# Patient Record
Sex: Female | Born: 1990 | Race: Black or African American | Hispanic: No | Marital: Single | Smoking: Never smoker
Health system: Southern US, Community
[De-identification: ages and names within clinical notes are randomized; demographics above are authoritative.]

## PROBLEM LIST (undated history)

## (undated) ENCOUNTER — Inpatient Hospital Stay (HOSPITAL_COMMUNITY): Payer: Self-pay

## (undated) DIAGNOSIS — D509 Iron deficiency anemia, unspecified: Secondary | ICD-10-CM

## (undated) DIAGNOSIS — N946 Dysmenorrhea, unspecified: Secondary | ICD-10-CM

## (undated) DIAGNOSIS — B86 Scabies: Secondary | ICD-10-CM

## (undated) DIAGNOSIS — M719 Bursopathy, unspecified: Secondary | ICD-10-CM

## (undated) DIAGNOSIS — R011 Cardiac murmur, unspecified: Secondary | ICD-10-CM

## (undated) DIAGNOSIS — L309 Dermatitis, unspecified: Secondary | ICD-10-CM

## (undated) DIAGNOSIS — M1990 Unspecified osteoarthritis, unspecified site: Secondary | ICD-10-CM

## (undated) DIAGNOSIS — K5909 Other constipation: Secondary | ICD-10-CM

## (undated) HISTORY — DX: Other constipation: K59.09

## (undated) HISTORY — DX: Iron deficiency anemia, unspecified: D50.9

## (undated) HISTORY — PX: ABDOMINAL HYSTERECTOMY: SHX81

## (undated) HISTORY — PX: COLONOSCOPY: SHX174

---
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
## 2008-01-25 ENCOUNTER — Emergency Department (HOSPITAL_COMMUNITY): Admission: EM | Admit: 2008-01-25 | Discharge: 2008-01-25 | Payer: Self-pay | Admitting: Emergency Medicine

## 2008-03-10 ENCOUNTER — Inpatient Hospital Stay (HOSPITAL_COMMUNITY): Admission: AD | Admit: 2008-03-10 | Discharge: 2008-03-10 | Payer: Self-pay | Admitting: Obstetrics & Gynecology

## 2008-06-15 ENCOUNTER — Emergency Department (HOSPITAL_COMMUNITY): Admission: EM | Admit: 2008-06-15 | Discharge: 2008-06-15 | Payer: Self-pay | Admitting: Emergency Medicine

## 2008-06-20 ENCOUNTER — Emergency Department (HOSPITAL_COMMUNITY): Admission: EM | Admit: 2008-06-20 | Discharge: 2008-06-21 | Payer: Self-pay | Admitting: Emergency Medicine

## 2008-08-08 ENCOUNTER — Emergency Department (HOSPITAL_COMMUNITY): Admission: EM | Admit: 2008-08-08 | Discharge: 2008-08-08 | Payer: Self-pay | Admitting: Emergency Medicine

## 2009-01-04 ENCOUNTER — Emergency Department (HOSPITAL_COMMUNITY): Admission: EM | Admit: 2009-01-04 | Discharge: 2009-01-04 | Payer: Self-pay | Admitting: Emergency Medicine

## 2009-01-08 ENCOUNTER — Emergency Department (HOSPITAL_COMMUNITY): Admission: EM | Admit: 2009-01-08 | Discharge: 2009-01-08 | Payer: Self-pay | Admitting: Emergency Medicine

## 2009-05-18 ENCOUNTER — Emergency Department (HOSPITAL_COMMUNITY): Admission: EM | Admit: 2009-05-18 | Discharge: 2009-05-19 | Payer: Self-pay | Admitting: Emergency Medicine

## 2009-06-25 ENCOUNTER — Emergency Department (HOSPITAL_COMMUNITY): Admission: EM | Admit: 2009-06-25 | Discharge: 2009-06-25 | Payer: Self-pay | Admitting: Family Medicine

## 2009-06-25 ENCOUNTER — Emergency Department (HOSPITAL_COMMUNITY): Admission: EM | Admit: 2009-06-25 | Discharge: 2009-06-25 | Payer: Self-pay | Admitting: Emergency Medicine

## 2009-07-15 ENCOUNTER — Emergency Department (HOSPITAL_COMMUNITY): Admission: EM | Admit: 2009-07-15 | Discharge: 2009-07-15 | Payer: Self-pay | Admitting: Emergency Medicine

## 2009-08-13 ENCOUNTER — Emergency Department (HOSPITAL_COMMUNITY): Admission: EM | Admit: 2009-08-13 | Discharge: 2009-08-14 | Payer: Self-pay | Admitting: Emergency Medicine

## 2009-10-24 ENCOUNTER — Emergency Department (HOSPITAL_COMMUNITY): Admission: EM | Admit: 2009-10-24 | Discharge: 2009-10-25 | Payer: Self-pay | Admitting: Emergency Medicine

## 2010-01-17 ENCOUNTER — Inpatient Hospital Stay (HOSPITAL_COMMUNITY): Admission: AD | Admit: 2010-01-17 | Discharge: 2010-01-17 | Payer: Self-pay | Admitting: Obstetrics and Gynecology

## 2010-02-26 ENCOUNTER — Inpatient Hospital Stay (HOSPITAL_COMMUNITY): Admission: AD | Admit: 2010-02-26 | Discharge: 2010-02-26 | Payer: Self-pay | Admitting: Obstetrics and Gynecology

## 2010-02-26 ENCOUNTER — Ambulatory Visit: Payer: Self-pay | Admitting: Physician Assistant

## 2010-03-06 ENCOUNTER — Inpatient Hospital Stay (HOSPITAL_COMMUNITY): Admission: AD | Admit: 2010-03-06 | Discharge: 2010-03-06 | Payer: Self-pay | Admitting: Obstetrics and Gynecology

## 2010-03-06 ENCOUNTER — Ambulatory Visit: Payer: Self-pay | Admitting: Family

## 2010-03-22 ENCOUNTER — Ambulatory Visit: Payer: Self-pay | Admitting: Nurse Practitioner

## 2010-03-22 ENCOUNTER — Inpatient Hospital Stay (HOSPITAL_COMMUNITY): Admission: AD | Admit: 2010-03-22 | Discharge: 2010-03-23 | Payer: Self-pay | Admitting: Obstetrics and Gynecology

## 2010-04-05 ENCOUNTER — Emergency Department (HOSPITAL_COMMUNITY): Admission: EM | Admit: 2010-04-05 | Discharge: 2010-04-05 | Payer: Self-pay | Admitting: Emergency Medicine

## 2010-04-22 ENCOUNTER — Inpatient Hospital Stay (HOSPITAL_COMMUNITY): Admission: AD | Admit: 2010-04-22 | Discharge: 2010-04-23 | Payer: Self-pay | Admitting: Obstetrics and Gynecology

## 2010-04-22 ENCOUNTER — Ambulatory Visit: Payer: Self-pay | Admitting: Nurse Practitioner

## 2010-05-02 ENCOUNTER — Inpatient Hospital Stay (HOSPITAL_COMMUNITY): Admission: AD | Admit: 2010-05-02 | Discharge: 2010-05-03 | Payer: Self-pay | Admitting: Obstetrics and Gynecology

## 2010-05-02 ENCOUNTER — Ambulatory Visit: Payer: Self-pay | Admitting: Family

## 2010-06-07 ENCOUNTER — Inpatient Hospital Stay (HOSPITAL_COMMUNITY)
Admission: AD | Admit: 2010-06-07 | Discharge: 2010-06-07 | Payer: Self-pay | Source: Home / Self Care | Admitting: Obstetrics and Gynecology

## 2010-08-19 ENCOUNTER — Inpatient Hospital Stay (HOSPITAL_COMMUNITY): Payer: No Typology Code available for payment source

## 2010-08-19 ENCOUNTER — Inpatient Hospital Stay (HOSPITAL_COMMUNITY)
Admission: AD | Admit: 2010-08-19 | Discharge: 2010-08-19 | Disposition: A | Discharge status: Home / Self Care | Payer: No Typology Code available for payment source | Source: Ambulatory Visit | Attending: Obstetrics and Gynecology | Admitting: Obstetrics and Gynecology

## 2010-08-19 ENCOUNTER — Emergency Department (HOSPITAL_COMMUNITY)
Admission: EM | Admit: 2010-08-19 | Discharge: 2010-08-19 | Disposition: A | Discharge status: Other Acute Inpatient Hospital | Payer: No Typology Code available for payment source | Source: Home / Self Care | Attending: Emergency Medicine | Admitting: Emergency Medicine

## 2010-08-19 DIAGNOSIS — O99891 Other specified diseases and conditions complicating pregnancy: Secondary | ICD-10-CM | POA: Insufficient documentation

## 2010-08-19 DIAGNOSIS — O9989 Other specified diseases and conditions complicating pregnancy, childbirth and the puerperium: Secondary | ICD-10-CM | POA: Insufficient documentation

## 2010-08-19 DIAGNOSIS — Y9289 Other specified places as the place of occurrence of the external cause: Secondary | ICD-10-CM | POA: Insufficient documentation

## 2010-08-19 DIAGNOSIS — M069 Rheumatoid arthritis, unspecified: Secondary | ICD-10-CM | POA: Insufficient documentation

## 2010-08-19 DIAGNOSIS — R1032 Left lower quadrant pain: Secondary | ICD-10-CM | POA: Insufficient documentation

## 2010-08-19 LAB — CBC
MCHC: 32.9 g/dL (ref 30.0–36.0)
Platelets: 278 10*3/uL (ref 150–400)
RDW: 12.9 % (ref 11.5–15.5)
WBC: 11 10*3/uL — ABNORMAL HIGH (ref 4.0–10.5)

## 2010-08-19 LAB — KLEIHAUER-BETKE STAIN
Fetal Cells %: 0 %
Quantitation Fetal Hemoglobin: 0 mL

## 2010-08-19 LAB — POCT I-STAT, CHEM 8
Creatinine, Ser: 0.6 mg/dL (ref 0.4–1.2)
HCT: 33 % — ABNORMAL LOW (ref 36.0–46.0)
Hemoglobin: 11.2 g/dL — ABNORMAL LOW (ref 12.0–15.0)
Potassium: 3.7 mEq/L (ref 3.5–5.1)
Sodium: 137 mEq/L (ref 135–145)
TCO2: 21 mmol/L (ref 0–100)

## 2010-09-06 ENCOUNTER — Inpatient Hospital Stay (HOSPITAL_COMMUNITY): Payer: 59

## 2010-09-06 ENCOUNTER — Inpatient Hospital Stay (HOSPITAL_COMMUNITY)
Admission: AD | Admit: 2010-09-06 | Discharge: 2010-09-11 | DRG: 765 | Disposition: A | Discharge status: Home / Self Care | Payer: 59 | Source: Ambulatory Visit | Attending: Obstetrics and Gynecology | Admitting: Obstetrics and Gynecology

## 2010-09-06 DIAGNOSIS — O4100X0 Oligohydramnios, unspecified trimester, not applicable or unspecified: Secondary | ICD-10-CM | POA: Diagnosis present

## 2010-09-07 ENCOUNTER — Inpatient Hospital Stay (HOSPITAL_COMMUNITY): Payer: 59

## 2010-09-07 LAB — CBC
Hemoglobin: 10.4 g/dL — ABNORMAL LOW (ref 12.0–15.0)
MCH: 27.6 pg (ref 26.0–34.0)
Platelets: 294 10*3/uL (ref 150–400)
RBC: 3.77 MIL/uL — ABNORMAL LOW (ref 3.87–5.11)
WBC: 9.4 10*3/uL (ref 4.0–10.5)

## 2010-09-07 LAB — RPR: RPR Ser Ql: NONREACTIVE

## 2010-09-10 LAB — CBC
HCT: 28.2 % — ABNORMAL LOW (ref 36.0–46.0)
Hemoglobin: 9 g/dL — ABNORMAL LOW (ref 12.0–15.0)
MCHC: 31.9 g/dL (ref 30.0–36.0)
RBC: 3.31 MIL/uL — ABNORMAL LOW (ref 3.87–5.11)
WBC: 11.8 10*3/uL — ABNORMAL HIGH (ref 4.0–10.5)

## 2010-09-14 NOTE — Op Note (Signed)
  NAME:  Becky Young, Becky Young                  ACCOUNT NO.:  [id number]  MEDICAL RECORD NO.:  [id number]           PATIENT TYPE:  I  LOCATION:  9108                          FACILITY:  [facility]  PHYSICIAN:  Chenell Lozon L. Reiner Loewen, M.D.DATE OF BIRTH:  February 13, 1991  DATE OF PROCEDURE:  09/09/2010 DATE OF DISCHARGE:                              OPERATIVE REPORT   PREOPERATIVE DIAGNOSIS:  Intrauterine pregnancy at 39 weeks and nonreassuring fetal heart rate.  POSTOPERATIVE DIAGNOSIS:  Intrauterine pregnancy at 39 weeks and nonreassuring fetal heart rate.  PROCEDURE:  Primary low-transverse cesarean section.  SURGEON:  Dewey Neukam L. Vincente Poli, MD  ANESTHESIA:  Epidural.  FINDINGS:  Female infant in cephalic presentation.  Cord wrapped around both, weight pending.  ESTIMATED BLOOD LOSS:  500 mL.  PATHOLOGY:  None.  DRAINS:  Foley catheter.  PROCEDURE:  The patient was taken from room #163 to Labor and Delivery. She had been consented for C-section.  Risks and benefits had been reviewed with the patient.  She signed the consent form after all questions were answered.  She was then prepped and draped.  Foley catheter had already been inserted while on Labor and Delivery.  A low- transverse incision was made after she was draped and carried down to the fascia.  Fascia was scored in the midline and extended laterally. The rectus muscles were separated in the midline.  The peritoneum was entered bluntly.  The lower uterine segment was identified.  It was very so thin, then I had to use the hemostat instead of a scalpel to open the uterus because I was afraid I would cut the baby and we made kind of a low transverse incision with the hemostat and then I spread it with my fingers.  The baby was in cephalic presentation, wish him to transverse position, delivered easily with a vacuum extractor with two pop-offs. There was cord around the feet.  The baby was a female infant.  The cord was clamped and cut and the  baby was taken to the newborn nursery.  The placenta was manually removed, noted to be normal intact with a three- vessel cord.  The uterus was exteriorized and was cleared of all debris. Antibiotics and Pitocin were given.  The uterine incision was closed in two layers using 0 chromic in a running locked stitch.  The uterus was returned to the abdomen.  Irrigation was performed.  Hemostasis was excellent.  The peritoneum was closed using 0 Vicryl.  The fascia was then closed using 0 Vicryl running stitch.  After irrigation of the subcutaneous layer, the skin was closed with staples.  Marcaine 0.25% was infiltrated.  All sponge, lap and instrument counts were correct x2.  The patient went to recovery room in stable condition.     Manjit Bufano L. Vincente Poli, M.D.     Florestine Avers  D:  09/09/2010  T:  09/09/2010  Job:  [id number]  Electronically Signed by Marcelle Overlie M.D. on 09/14/2010 01:44:28 PM

## 2010-09-27 LAB — URINALYSIS, ROUTINE W REFLEX MICROSCOPIC
Glucose, UA: NEGATIVE mg/dL
Protein, ur: NEGATIVE mg/dL
Specific Gravity, Urine: 1.015 (ref 1.005–1.030)

## 2010-09-28 LAB — KLEIHAUER-BETKE STAIN
Fetal Cells %: 0.7 %
Quantitation Fetal Hemoglobin: 35 mL

## 2010-09-29 LAB — URINE CULTURE
Colony Count: 3000
Culture  Setup Time: 201109070421

## 2010-09-29 LAB — DIFFERENTIAL
Basophils Absolute: 0.1 10*3/uL (ref 0.0–0.1)
Basophils Relative: 0 % (ref 0–1)
Basophils Relative: 1 % (ref 0–1)
Eosinophils Absolute: 0.4 10*3/uL (ref 0.0–0.7)
Eosinophils Absolute: 0.4 10*3/uL (ref 0.0–0.7)
Eosinophils Relative: 4 % (ref 0–5)
Monocytes Absolute: 0.7 10*3/uL (ref 0.1–1.0)
Monocytes Relative: 6 % (ref 3–12)
Monocytes Relative: 6 % (ref 3–12)
Neutro Abs: 7 10*3/uL (ref 1.7–7.7)
Neutrophils Relative %: 70 % (ref 43–77)

## 2010-09-29 LAB — GC/CHLAMYDIA PROBE AMP, GENITAL: GC Probe Amp, Genital: NEGATIVE

## 2010-09-29 LAB — URINALYSIS, ROUTINE W REFLEX MICROSCOPIC
Bilirubin Urine: NEGATIVE
Bilirubin Urine: NEGATIVE
Hgb urine dipstick: NEGATIVE
Hgb urine dipstick: NEGATIVE
Ketones, ur: NEGATIVE mg/dL
Protein, ur: NEGATIVE mg/dL
Specific Gravity, Urine: 1.02 (ref 1.005–1.030)
Specific Gravity, Urine: 1.025 (ref 1.005–1.030)
Urobilinogen, UA: 0.2 mg/dL (ref 0.0–1.0)
pH: 6 (ref 5.0–8.0)

## 2010-09-29 LAB — CBC
HCT: 33.4 % — ABNORMAL LOW (ref 36.0–46.0)
Hemoglobin: 11.3 g/dL — ABNORMAL LOW (ref 12.0–15.0)
MCH: 29 pg (ref 26.0–34.0)
MCH: 29.5 pg (ref 26.0–34.0)
MCHC: 33.9 g/dL (ref 30.0–36.0)
MCHC: 33.9 g/dL (ref 30.0–36.0)
MCV: 86.9 fL (ref 78.0–100.0)
Platelets: 248 10*3/uL (ref 150–400)
RDW: 16.2 % — ABNORMAL HIGH (ref 11.5–15.5)

## 2010-09-29 LAB — BASIC METABOLIC PANEL
CO2: 21 mEq/L (ref 19–32)
Glucose, Bld: 94 mg/dL (ref 70–99)
Potassium: 3.4 mEq/L — ABNORMAL LOW (ref 3.5–5.1)
Sodium: 132 mEq/L — ABNORMAL LOW (ref 135–145)

## 2010-09-29 LAB — INFLUENZA PANEL BY PCR (TYPE A & B): Influenza A By PCR: NEGATIVE

## 2010-09-29 LAB — URINE MICROSCOPIC-ADD ON

## 2010-09-30 LAB — URINE CULTURE: Colony Count: 25000

## 2010-09-30 LAB — URINALYSIS, ROUTINE W REFLEX MICROSCOPIC
Bilirubin Urine: NEGATIVE
Hgb urine dipstick: NEGATIVE
Hgb urine dipstick: NEGATIVE
Nitrite: NEGATIVE
Protein, ur: NEGATIVE mg/dL
Specific Gravity, Urine: 1.02 (ref 1.005–1.030)
Urobilinogen, UA: 0.2 mg/dL (ref 0.0–1.0)

## 2010-09-30 LAB — URINE MICROSCOPIC-ADD ON

## 2010-10-02 LAB — URINE MICROSCOPIC-ADD ON

## 2010-10-02 LAB — URINALYSIS, ROUTINE W REFLEX MICROSCOPIC
Bilirubin Urine: NEGATIVE
Nitrite: POSITIVE — AB
Specific Gravity, Urine: 1.02 (ref 1.005–1.030)
Urobilinogen, UA: 0.2 mg/dL (ref 0.0–1.0)
pH: 7 (ref 5.0–8.0)

## 2010-10-02 LAB — CBC
HCT: 33.2 % — ABNORMAL LOW (ref 36.0–46.0)
MCH: 27.9 pg (ref 26.0–34.0)
MCHC: 33.8 g/dL (ref 30.0–36.0)
MCV: 82.5 fL (ref 78.0–100.0)
Platelets: 279 10*3/uL (ref 150–400)
RDW: 16 % — ABNORMAL HIGH (ref 11.5–15.5)

## 2010-10-02 LAB — URINE CULTURE

## 2010-10-02 LAB — WET PREP, GENITAL
Clue Cells Wet Prep HPF POC: NONE SEEN
Trich, Wet Prep: NONE SEEN

## 2010-10-02 LAB — GC/CHLAMYDIA PROBE AMP, GENITAL
Chlamydia, DNA Probe: NEGATIVE
Chlamydia, DNA Probe: NEGATIVE

## 2010-10-05 LAB — URINALYSIS, ROUTINE W REFLEX MICROSCOPIC
Bilirubin Urine: NEGATIVE
Glucose, UA: NEGATIVE mg/dL
Nitrite: NEGATIVE
Protein, ur: NEGATIVE mg/dL
pH: 6.5 (ref 5.0–8.0)

## 2010-10-05 LAB — COMPREHENSIVE METABOLIC PANEL
AST: 22 U/L (ref 0–37)
BUN: 16 mg/dL (ref 6–23)
CO2: 24 mEq/L (ref 19–32)
Chloride: 105 mEq/L (ref 96–112)
Creatinine, Ser: 0.74 mg/dL (ref 0.4–1.2)
GFR calc Af Amer: >60 mL/min (ref >60)
GFR calc non Af Amer: >60 mL/min (ref >60)
Glucose, Bld: 102 mg/dL — ABNORMAL HIGH (ref 70–99)
Total Bilirubin: 0.6 mg/dL (ref 0.3–1.2)

## 2010-10-05 LAB — URINE MICROSCOPIC-ADD ON

## 2010-10-05 LAB — CBC
Hemoglobin: 12.5 g/dL (ref 12.0–15.0)
RBC: 4.46 MIL/uL (ref 3.87–5.11)
WBC: 7.3 10*3/uL (ref 4.0–10.5)

## 2010-10-05 LAB — DIFFERENTIAL
Basophils Absolute: 0.1 10*3/uL (ref 0.0–0.1)
Eosinophils Relative: 3 % (ref 0–5)
Lymphocytes Relative: 26 % (ref 12–46)

## 2010-10-06 NOTE — Discharge Summary (Signed)
NAME:  Becky Young, Becky Young                  ACCOUNT NO.:  [id number]  MEDICAL RECORD NO.:  [id number]         PATIENT TYPE:  WINP  LOCATION:                                [facility]ILITY:  [facility]  PHYSICIAN:  Christerpher Clos L. Jaelene Garciagarcia, M.D.DATE OF BIRTH:  25-Jan-1991  DATE OF ADMISSION:  09/06/2010 DATE OF DISCHARGE:                              DISCHARGE SUMMARY   ADMITTING DIAGNOSES: 1. Intrauterine pregnancy at 44 weeks estimated gestational age. 2. Low amniotic fluid index. 3. Nonreassuring fetal heart tones. 4. Induction of labor.  DISCHARGE DIAGNOSIS:  1. Status post low transverse cesarean section secondary to nonreassuring fetal heart tones. 2. Viable female infant  REASON FOR ADMISSION:  Please see written H and P.  HOSPITAL COURSE:  The patient is a 20 year old gravida 2, para 0-0-1-0 that was admitted to [facility] for an induction of labor.  The patient had been seen in the office where she had sustained a prolonged deceleration.  Ultrasound was performed which revealed baby was in the vertex presentation with low AFI; however, biophysical profile revealed score 8/8.  On admission, vital signs were stable. Cervix was examined and found to be fingertip posterior with vertex presentation.  On the following morning, the patient was complaining of some pain associated contractions.  Fetal heart tones were in the 140s. Cervix was examined and found to be no further change.  Contractions were occurring approximately every 2-4 minutes.  The Pitocin was started for augmentation of her labor.  Later that evening, vital signs remained stable, fetal heart tones in the 140s with good acceleration, no decelerations were seen.  Contractions were approximately every 2-3 minutes on Pitocin.  Cervix was continued to be closed, 50%, vertex, at a -3 station.  The patient was offered a cesarean delivery; however, she desired to continue with the induction.  On the following morning, decision  was discussed with the patient regarding assistance with artificial rupture of membranes, the patient was very resistant.  She had also been offered cesarean delivery a numerous times and she continued to refuse.  Fetal heart tones were within normal limits. Contractions were approximately every 3-4 minutes.  Cervix was examined and found to be a tight finger tip, 30% effaced, vertex, at -2 station. Pitocin was continued.  The patient was given an epidural for her comfort and later that morning, fetal heart tones were now observed to have some repetitive late decelerations.  Cervix was reexamined and found to be now at 2 cm dilated, 80% effaced, vertex, at -2 station with caput that was forming.  Given the trace and the remote possibility of a vaginal delivery, once again delivery was recommended for cesarean delivery.  The patient was now consented and transferred to the operating room where epidural was dosed to an adequate surgical level. A low transverse incision was made with delivery of a viable female infant, weighing 6 pounds 2 ounces, with Apgars of 9 at 1 and 9 at 5 minutes.  Umbilical cord was noted to be wrapped around the baby.  She tolerated procedure well and taken to the recovery room in stable condition.  On postoperative day 1, the patient was without complaint. Vital signs were stable.  Abdomen soft.  Abdominal dressing was noted to be clean, dry, and intact.  Uterus was firm, nontender.  On postoperative day 2, the patient did desire early discharge.  Vital signs were stable.  Fundus firm and nontender.  Incision was clean, dry, and intact.  Discharge instructions were reviewed, and the patient was later discharged home.  CONDITION ON DISCHARGE:  Stable.  DIET:  Regular as tolerated.  ACTIVITY:  No heavy lifting, no driving x2 weeks, no vaginal entry.  FOLLOWUP:  The patient will follow up in the office in 1 week for an incision check.  She is to call for  temperature greater than 100 degrees, persistent nausea, vomiting, heavy vaginal bleeding and/or redness or drainage from the incisional site.  DISCHARGE MEDICATIONS: 1. Tylox #30 one p.o. every 4-6 hours p.r.n. 2. Motrin 600 mg every 6 hours. 3. Prenatal vitamins one p.o. daily. 4. Colace one p.o. daily.     Becky Young, N.P.   ______________________________ Stann Mainland. Vincente Poli, M.D.    CC/MEDQ  D:  09/30/2010  T:  10/01/2010  Job:  [id number]  Electronically Signed by Becky Young N.P. on 10/04/2010 08:45:50 AM Electronically Signed by Marcelle Overlie M.D. on 10/06/2010 01:43:48 PM

## 2010-10-31 LAB — POCT URINALYSIS DIP (DEVICE)
Ketones, ur: NEGATIVE mg/dL
Protein, ur: 30 mg/dL — AB
Urobilinogen, UA: 0.2 mg/dL (ref 0.0–1.0)

## 2010-10-31 LAB — POCT PREGNANCY, URINE: Preg Test, Ur: NEGATIVE

## 2011-01-25 ENCOUNTER — Emergency Department (HOSPITAL_COMMUNITY)
Admission: EM | Admit: 2011-01-25 | Discharge: 2011-01-25 | Disposition: A | Discharge status: Home / Self Care | Payer: No Typology Code available for payment source | Attending: Emergency Medicine | Admitting: Emergency Medicine

## 2011-04-07 ENCOUNTER — Encounter (HOSPITAL_COMMUNITY): Payer: Self-pay

## 2011-04-07 ENCOUNTER — Inpatient Hospital Stay (HOSPITAL_COMMUNITY)
Admission: AD | Admit: 2011-04-07 | Discharge: 2011-04-07 | Disposition: A | Discharge status: Home / Self Care | Payer: 59 | Source: Ambulatory Visit | Attending: Obstetrics and Gynecology | Admitting: Obstetrics and Gynecology

## 2011-04-07 DIAGNOSIS — N938 Other specified abnormal uterine and vaginal bleeding: Secondary | ICD-10-CM | POA: Insufficient documentation

## 2011-04-07 DIAGNOSIS — N949 Unspecified condition associated with female genital organs and menstrual cycle: Secondary | ICD-10-CM | POA: Insufficient documentation

## 2011-04-07 DIAGNOSIS — N921 Excessive and frequent menstruation with irregular cycle: Secondary | ICD-10-CM

## 2011-04-07 HISTORY — DX: Dermatitis, unspecified: L30.9

## 2011-04-07 LAB — URINALYSIS, ROUTINE W REFLEX MICROSCOPIC
Bilirubin Urine: NEGATIVE
Glucose, UA: NEGATIVE mg/dL
Ketones, ur: NEGATIVE mg/dL
Leukocytes, UA: NEGATIVE
Nitrite: NEGATIVE
Protein, ur: NEGATIVE mg/dL
Specific Gravity, Urine: 1.025 (ref 1.005–1.030)
Urobilinogen, UA: 0.2 mg/dL (ref 0.0–1.0)
pH: 6 (ref 5.0–8.0)

## 2011-04-07 LAB — URINE MICROSCOPIC-ADD ON

## 2011-04-07 LAB — WET PREP, GENITAL
Clue Cells Wet Prep HPF POC: NONE SEEN
Yeast Wet Prep HPF POC: NONE SEEN

## 2011-04-07 MED ORDER — IBUPROFEN 600 MG PO TABS: 600.0000 mg | ORAL_TABLET | Freq: Four times a day (QID) | ORAL | Status: AC | PRN

## 2011-04-07 NOTE — Progress Notes (Signed)
Pt states she has been on Depo for a while, had her third shot about one month ago and continues to have brown spotting. Has some pain at night, no pain at this time.

## 2011-04-07 NOTE — ED Provider Notes (Signed)
This is a G1 P1 who has been on Depo-Provera for birth control. She had her third shot about a month ago. She is concerned that she's had brown spotting intermittently at unexpected times the entire time she has been on Depo-Provera. Along with the spotting she has cramping which is similar to the pain she experienced in the past with dysmenorrhea.  Review of Systems  Constitutional: Negative for fever and chills.  Gastrointestinal: Positive for abdominal pain. Negative for nausea, vomiting, diarrhea and constipation.  Genitourinary: Negative for dysuria, urgency, frequency and hematuria.   Past Medical History  Diagnosis Date  . Eczema    Past Surgical History  Procedure Date  . Cesarean section    Past Surgical History  Procedure Date  . Cesarean section    History   Social History  . Marital Status: Single    Spouse Name: N/A    Number of Children: N/A  . Years of Education: N/A   Occupational History  . Not on file.   Social History Main Topics  . Smoking status: Never Smoker   . Smokeless tobacco: Not on file  . Alcohol Use: No  . Drug Use: No  . Sexually Active: Yes    Birth Control/ Protection: Injection     depo provera   Other Topics Concern  . Not on file   Social History Narrative  . No narrative on file    Objective  Filed Vitals:   04/07/11 1125  BP: 130/88  Pulse: 101  Temp: 99.1 F (37.3 C)  Resp: 20   Physical exam:  General: WN, WD, in NAD  Abdomen soft nontender mildly obese  Pelvic: NEFG, BUS negative. Spec: physiologic discharge, scant amount brown blood, cervix clean. Bimanual reveals no CMT, and uterus  NSSP. No adnexal tenderness or masses.   Results for orders placed during the hospital encounter of 04/07/11 (from the past 24 hour(s))  URINALYSIS, ROUTINE W REFLEX MICROSCOPIC     Status: Abnormal   Collection Time   04/07/11 11:35 AM      Component Value Range   Color, Urine YELLOW  YELLOW    Appearance CLEAR  CLEAR    Specific Gravity, Urine 1.025  1.005 - 1.030    pH 6.0  5.0 - 8.0    Glucose, UA NEGATIVE  NEGATIVE (mg/dL)   Hgb urine dipstick MODERATE (*) NEGATIVE    Bilirubin Urine NEGATIVE  NEGATIVE    Ketones, ur NEGATIVE  NEGATIVE (mg/dL)   Protein, ur NEGATIVE  NEGATIVE (mg/dL)   Urobilinogen, UA 0.2  0.0 - 1.0 (mg/dL)   Nitrite NEGATIVE  NEGATIVE    Leukocytes, UA NEGATIVE  NEGATIVE   URINE MICROSCOPIC-ADD ON     Status: Abnormal   Collection Time   04/07/11 11:35 AM      Component Value Range   Squamous Epithelial / LPF RARE  RARE    WBC, UA 0-2  <3 (WBC/hpf)   RBC / HPF 0-2  <3 (RBC/hpf)   Bacteria, UA FEW (*) RARE    Urine-Other MUCOUS PRESENT    POCT PREGNANCY, URINE     Status: Normal   Collection Time   04/07/11 11:38 AM      Component Value Range   Preg Test, Ur NEGATIVE    WET PREP, GENITAL     Status: Abnormal   Collection Time   04/07/11  1:05 PM      Component Value Range   Yeast, Wet Prep NONE SEEN  NONE SEEN  Trich, Wet Prep NONE SEEN  NONE SEEN    Clue Cells, Wet Prep NONE SEEN  NONE SEEN    WBC, Wet Prep HPF POC FEW (*) NONE SEEN    A/P Breakthrough bleeding on Depo Reassured re: effectiveness. May use ibuprofen 600 mg po q6h prn pain. F/U at [facility] for next Depo, sooner if needed.

## 2011-04-08 LAB — GC/CHLAMYDIA PROBE AMP, GENITAL: Chlamydia, DNA Probe: NEGATIVE

## 2011-04-21 LAB — POCT PREGNANCY, URINE: Preg Test, Ur: NEGATIVE

## 2011-04-21 LAB — WET PREP, GENITAL: Trich, Wet Prep: NONE SEEN

## 2011-04-21 LAB — URINALYSIS, ROUTINE W REFLEX MICROSCOPIC
Bilirubin Urine: NEGATIVE
Hgb urine dipstick: NEGATIVE
Protein, ur: NEGATIVE mg/dL
Urobilinogen, UA: 0.2 mg/dL (ref 0.0–1.0)

## 2011-04-21 LAB — URINE MICROSCOPIC-ADD ON

## 2011-04-21 LAB — GC/CHLAMYDIA PROBE AMP, GENITAL: GC Probe Amp, Genital: NEGATIVE

## 2011-04-27 LAB — WET PREP, GENITAL
Clue Cells Wet Prep HPF POC: NONE SEEN
Trich, Wet Prep: NONE SEEN
Yeast Wet Prep HPF POC: NONE SEEN

## 2011-04-27 LAB — POCT URINALYSIS DIP (DEVICE)
Bilirubin Urine: NEGATIVE
Operator id: 239701
Protein, ur: NEGATIVE
Specific Gravity, Urine: 1.025

## 2011-04-27 LAB — GC/CHLAMYDIA PROBE AMP, GENITAL: GC Probe Amp, Genital: NEGATIVE

## 2011-05-27 ENCOUNTER — Emergency Department (HOSPITAL_COMMUNITY): Payer: 59

## 2011-05-27 ENCOUNTER — Encounter (HOSPITAL_COMMUNITY): Payer: Self-pay | Admitting: *Deleted

## 2011-05-27 ENCOUNTER — Emergency Department (HOSPITAL_COMMUNITY)
Admission: EM | Admit: 2011-05-27 | Discharge: 2011-05-28 | Disposition: A | Discharge status: Home / Self Care | Payer: 59 | Attending: Emergency Medicine | Admitting: Emergency Medicine

## 2011-05-27 DIAGNOSIS — M25579 Pain in unspecified ankle and joints of unspecified foot: Secondary | ICD-10-CM | POA: Insufficient documentation

## 2011-05-27 DIAGNOSIS — X500XXA Overexertion from strenuous movement or load, initial encounter: Secondary | ICD-10-CM | POA: Insufficient documentation

## 2011-05-27 DIAGNOSIS — S93409A Sprain of unspecified ligament of unspecified ankle, initial encounter: Secondary | ICD-10-CM | POA: Insufficient documentation

## 2011-05-27 DIAGNOSIS — S93402A Sprain of unspecified ligament of left ankle, initial encounter: Secondary | ICD-10-CM

## 2011-05-27 NOTE — ED Notes (Signed)
Pt in s/p fall c/o left ankle pain, increased pain with movement, also right ankle pain but not as severe

## 2011-05-28 MED ORDER — ACETAMINOPHEN-CODEINE #3 300-30 MG PO TABS: 1.0000 | ORAL_TABLET | Freq: Four times a day (QID) | ORAL | Status: AC | PRN

## 2011-05-28 MED ORDER — IBUPROFEN 800 MG PO TABS: 800.0000 mg | ORAL_TABLET | Freq: Three times a day (TID) | ORAL | Status: AC

## 2011-05-28 MED ORDER — IBUPROFEN 800 MG PO TABS
800.0000 mg | ORAL_TABLET | Freq: Once | ORAL | Status: AC
  Administered 2011-05-28: 800 mg via ORAL
  Filled 2011-05-28: qty 1

## 2011-05-28 MED ORDER — OXYCODONE-ACETAMINOPHEN 5-325 MG PO TABS
1.0000 | ORAL_TABLET | Freq: Once | ORAL | Status: AC
  Administered 2011-05-28: 1 via ORAL
  Filled 2011-05-28: qty 1

## 2011-05-28 NOTE — ED Provider Notes (Signed)
Medical screening examination/treatment/procedure(s) were performed by non-physician practitioner and as supervising physician I was immediately available for consultation/collaboration.  Flint Melter, MD 05/28/11 2038

## 2011-05-28 NOTE — ED Provider Notes (Signed)
History     CSN: [id number] Arrival date & time: 05/27/2011 10:14 PM   None     Chief Complaint  Patient presents with  . Fall  . Ankle Pain    (Consider location/radiation/quality/duration/timing/severity/associated sxs/prior treatment) HPI  Patient presents to emergency department with her mother at bedside complaining of acute onset left ankle pain when she tripped while walking down a hill prior to arrival causing eversion of ankle and a "pop" and left ankle followed by gradual onset right ankle pain. Patient denies hitting her head or loss of consciousness. Patient denies additional injury. Patient states left ankle pain is aggravated by ambulation and weightbearing. She states she's able to bear weight on right ankle with less pain. Patient did not take anything for pain prior to arrival. Patient denies numbness/tingling/or weakness of lower extremity.  Past Medical History  Diagnosis Date  . Eczema     Past Surgical History  Procedure Date  . Cesarean section     History reviewed. No pertinent family history.  History  Substance Use Topics  . Smoking status: Never Smoker   . Smokeless tobacco: Not on file  . Alcohol Use: No    OB History    Grav Para Term Preterm Abortions TAB SAB Ect Mult Living   1 1 1  0 0 0 0 0 0 1      Review of Systems  All other systems reviewed and are negative.    Allergies  Almond extract  Home Medications   Current Outpatient Rx  Name Route Sig Dispense Refill  . ACETAMINOPHEN-CODEINE #3 300-30 MG PO TABS Oral Take 1-2 tablets by mouth every 6 (six) hours as needed for pain. 15 tablet 0  . IBUPROFEN 800 MG PO TABS Oral Take 1 tablet (800 mg total) by mouth 3 (three) times daily. 21 tablet 0    BP 122/77  Pulse 104  Temp(Src) 98.7 F (37.1 C) (Oral)  Resp 20  SpO2 100%  Physical Exam  Constitutional: She is oriented to person, place, and time. She appears well-developed and well-nourished. No distress.  HENT:    Head: Normocephalic and atraumatic.  Eyes: Conjunctivae are normal.  Cardiovascular: Normal rate and regular rhythm.   Pulmonary/Chest: Effort normal.  Musculoskeletal:       Right ankle: She exhibits swelling. tenderness.       Mild Swelling and TTP of left lateral ankle but no TTP or swelling of fore foot or calf. No break in skin. Good pedal pulse and cap refill of all toes. Wiggling toes without difficulty.  Mild tenderness to palpation of right medial ankle but no tenderness to palpation forefoot or toes. Good pedal pulse. No break in skin. Good cap refill. No swelling.   Neurological: She is alert and oriented to person, place, and time.       Normal sensation of entire foot.   Skin: Skin is warm and dry. No rash noted. She is not diaphoretic. No erythema. No pallor.  Psychiatric: She has a normal mood and affect. Her behavior is normal.    ED Course  Procedures (including critical care time)  By mouth Percocet and ibuprofen.  Labs Reviewed - No data to display Dg Ankle Complete Left  05/27/2011  *RADIOLOGY REPORT*  Clinical Data: Status post fall; twisted left ankle, with lateral ankle pain.  LEFT ANKLE COMPLETE - 3+ VIEW  Comparison: None.  Findings: There is no evidence of fracture or dislocation.  The ankle mortise is intact; the interosseous space is  within normal limits.  No talar tilt or subluxation is seen.  The joint spaces are preserved.  No significant soft tissue abnormalities are seen.  IMPRESSION: No evidence of fracture or dislocation.  Original Report Authenticated By: Tonia Ghent, M.D.   Dg Ankle Complete Right  05/27/2011  *RADIOLOGY REPORT*  Clinical Data: Status post fall; twisted right ankle, with lateral right ankle pain.  RIGHT ANKLE - COMPLETE 3+ VIEW  Comparison: None.  Findings: There is no evidence of fracture or dislocation.  The ankle mortise is intact; the interosseous space is within normal limits.  No talar tilt or subluxation is seen.  A likely os  peroneum is noted.  The joint spaces are preserved.  No significant soft tissue abnormalities are seen.  IMPRESSION:  1.  No evidence of fracture or dislocation. 2.  Likely os peroneum noted.  Original Report Authenticated By: Tonia Ghent, M.D.     1. Sprain of left ankle       MDM  No acute findings of bilateral ankles on x-rays. No breaks skin. Bilateral lower extremity neurovascular intact. Patient denies additional injury other than her bilateral ankles.        [place], [place] 05/28/11 [phone]

## 2011-07-22 ENCOUNTER — Inpatient Hospital Stay (HOSPITAL_COMMUNITY)
Admission: AD | Admit: 2011-07-22 | Discharge: 2011-07-23 | Disposition: A | Discharge status: Home / Self Care | Payer: Self-pay | Source: Ambulatory Visit | Attending: Family Medicine | Admitting: Family Medicine

## 2011-07-22 ENCOUNTER — Encounter (HOSPITAL_COMMUNITY): Payer: Self-pay

## 2011-07-22 DIAGNOSIS — N949 Unspecified condition associated with female genital organs and menstrual cycle: Secondary | ICD-10-CM | POA: Insufficient documentation

## 2011-07-22 DIAGNOSIS — R102 Pelvic and perineal pain: Secondary | ICD-10-CM

## 2011-07-22 DIAGNOSIS — R109 Unspecified abdominal pain: Secondary | ICD-10-CM | POA: Insufficient documentation

## 2011-07-22 LAB — WET PREP, GENITAL
Clue Cells Wet Prep HPF POC: NONE SEEN
Trich, Wet Prep: NONE SEEN
Yeast Wet Prep HPF POC: NONE SEEN

## 2011-07-22 NOTE — ED Provider Notes (Signed)
History     Chief Complaint  Patient presents with  . Abdominal Pain  . Back Pain   HPI Patient is here with c/o lower midpelvic pain x 1 week that radiates to lower back.  LMP 06/16/11. She denies any vaginal bleeding or vaginal discharge.    Past Medical History  Diagnosis Date  . Eczema     Past Surgical History  Procedure Date  . Cesarean section     History reviewed. No pertinent family history.  History  Substance Use Topics  . Smoking status: Never Smoker   . Smokeless tobacco: Not on file  . Alcohol Use: No    Allergies:  Allergies  Allergen Reactions  . Almond Extract Shortness Of Breath and Swelling    Prescriptions prior to admission  Medication Sig Dispense Refill  . desogestrel-ethinyl estradiol (APRI,EMOQUETTE,SOLIA) 0.15-30 MG-MCG tablet Take 1 tablet by mouth daily.        . halobetasol (ULTRAVATE) 0.05 % ointment Apply 1 application topically daily. For eczema       . naproxen sodium (ANAPROX) 220 MG tablet Take 220 mg by mouth 2 (two) times daily with a meal. For pain       . ranitidine (ZANTAC) 150 MG tablet Take 150 mg by mouth 2 (two) times daily. For heartburn         ROS Physical Exam   Blood pressure 122/71, pulse 92, temperature 99.1 F (37.3 C), temperature source Oral, resp. rate 18, height 5\' 3"  (1.6 m), weight 78.586 kg (173 lb 4 oz), last menstrual period 06/16/2011, SpO2 98.00%.  Physical Exam  Constitutional: She is oriented to person, place, and time. She appears well-developed and well-nourished. No distress.  HENT:  Head: Normocephalic.  Neck: Normal range of motion. Neck supple.  Cardiovascular: Normal rate and regular rhythm.   Respiratory: Effort normal and breath sounds normal.  GI: Soft. Bowel sounds are normal. She exhibits no mass. There is no tenderness. There is no rebound and no guarding.  Genitourinary: Vaginal discharge (white, creamy) found.  Neurological: She is alert and oriented to person, place, and time.  She has normal reflexes.  Skin: Skin is warm and dry.    MAU Course  Procedures    Results for orders placed during the hospital encounter of 07/22/11 (from the past 24 hour(s))  URINALYSIS, ROUTINE W REFLEX MICROSCOPIC     Status: Normal   Collection Time   07/22/11  9:07 PM      Component Value Range   Color, Urine YELLOW  YELLOW    APPearance CLEAR  CLEAR    Specific Gravity, Urine 1.030  1.005 - 1.030    pH 6.0  5.0 - 8.0    Glucose, UA NEGATIVE  NEGATIVE (mg/dL)   Hgb urine dipstick NEGATIVE  NEGATIVE    Bilirubin Urine NEGATIVE  NEGATIVE    Ketones, ur NEGATIVE  NEGATIVE (mg/dL)   Protein, ur NEGATIVE  NEGATIVE (mg/dL)   Urobilinogen, UA 0.2  0.0 - 1.0 (mg/dL)   Nitrite NEGATIVE  NEGATIVE    Leukocytes, UA NEGATIVE  NEGATIVE   POCT PREGNANCY, URINE     Status: Normal   Collection Time   07/22/11  9:35 PM      Component Value Range   Preg Test, Ur NEGATIVE    WET PREP, GENITAL     Status: Abnormal   Collection Time   07/22/11  9:40 PM      Component Value Range   Yeast, Wet Prep NONE SEEN  NONE SEEN    Trich, Wet Prep NONE SEEN  NONE SEEN    Clue Cells, Wet Prep NONE SEEN  NONE SEEN    WBC, Wet Prep HPF POC FEW (*) NONE SEEN      Assessment and Plan  Pelvic Pain - Menses Onset??  Plan: DC to home GC/CT pending  [facility] 07/22/2011, 10:03 PM

## 2011-07-22 NOTE — Progress Notes (Signed)
Patient is here with c/o lower to right quadrant abdominal pain and lower dull aching back pain for a month. She states that she have missed a period. lmp 06/16/11. She denies any vaginal bleeding or vaginal discharge.

## 2011-07-23 LAB — URINALYSIS, ROUTINE W REFLEX MICROSCOPIC
Glucose, UA: NEGATIVE mg/dL
Hgb urine dipstick: NEGATIVE
Protein, ur: NEGATIVE mg/dL
pH: 6 (ref 5.0–8.0)

## 2011-07-23 NOTE — ED Provider Notes (Signed)
Chart reviewed and agree with management and plan.  

## 2011-07-24 LAB — GC/CHLAMYDIA PROBE AMP, GENITAL
Chlamydia, DNA Probe: NEGATIVE
GC Probe Amp, Genital: NEGATIVE

## 2011-07-29 ENCOUNTER — Emergency Department (HOSPITAL_COMMUNITY): Payer: Self-pay

## 2011-07-29 ENCOUNTER — Emergency Department (HOSPITAL_COMMUNITY)
Admission: EM | Admit: 2011-07-29 | Discharge: 2011-07-29 | Disposition: A | Discharge status: Home / Self Care | Payer: Self-pay | Attending: Emergency Medicine | Admitting: Emergency Medicine

## 2011-07-29 DIAGNOSIS — M545 Low back pain, unspecified: Secondary | ICD-10-CM | POA: Insufficient documentation

## 2011-07-29 DIAGNOSIS — Z79899 Other long term (current) drug therapy: Secondary | ICD-10-CM | POA: Insufficient documentation

## 2011-07-29 DIAGNOSIS — R109 Unspecified abdominal pain: Secondary | ICD-10-CM | POA: Insufficient documentation

## 2011-07-29 LAB — URINALYSIS, ROUTINE W REFLEX MICROSCOPIC
Bilirubin Urine: NEGATIVE
Hgb urine dipstick: NEGATIVE
Nitrite: NEGATIVE
Specific Gravity, Urine: 1.025 (ref 1.005–1.030)
pH: 6 (ref 5.0–8.0)

## 2011-07-29 LAB — DIFFERENTIAL
Basophils Absolute: 0 10*3/uL (ref 0.0–0.1)
Basophils Relative: 0 % (ref 0–1)
Eosinophils Absolute: 0.2 10*3/uL (ref 0.0–0.7)
Neutrophils Relative %: 66 % (ref 43–77)

## 2011-07-29 LAB — COMPREHENSIVE METABOLIC PANEL
ALT: 10 U/L (ref 0–35)
Albumin: 3.7 g/dL (ref 3.5–5.2)
Alkaline Phosphatase: 69 U/L (ref 39–117)
Calcium: 9.8 mg/dL (ref 8.4–10.5)
Potassium: 3.9 mEq/L (ref 3.5–5.1)
Sodium: 138 mEq/L (ref 135–145)
Total Protein: 8.3 g/dL (ref 6.0–8.3)

## 2011-07-29 LAB — URINE MICROSCOPIC-ADD ON

## 2011-07-29 LAB — POCT PREGNANCY, URINE: Preg Test, Ur: NEGATIVE

## 2011-07-29 LAB — CBC
MCH: 27.8 pg (ref 26.0–34.0)
MCHC: 33.1 g/dL (ref 30.0–36.0)
Platelets: 318 10*3/uL (ref 150–400)
RBC: 4.86 MIL/uL (ref 3.87–5.11)
RDW: 12.2 % (ref 11.5–15.5)

## 2011-07-29 MED ORDER — DIPHENHYDRAMINE HCL 50 MG/ML IJ SOLN
INTRAMUSCULAR | Status: AC
  Administered 2011-07-29: 25 mg
  Filled 2011-07-29: qty 1

## 2011-07-29 MED ORDER — SODIUM CHLORIDE 0.9 % IV SOLN
INTRAVENOUS | Status: DC
  Administered 2011-07-29: 16:00:00 via INTRAVENOUS

## 2011-07-29 MED ORDER — HYDROCODONE-ACETAMINOPHEN 5-325 MG PO TABS: ORAL_TABLET | ORAL | Status: AC

## 2011-07-29 MED ORDER — KETOROLAC TROMETHAMINE 30 MG/ML IJ SOLN
30.0000 mg | Freq: Once | INTRAMUSCULAR | Status: AC
  Administered 2011-07-29: 30 mg via INTRAVENOUS
  Filled 2011-07-29: qty 1

## 2011-07-29 MED ORDER — IOHEXOL 300 MG/ML  SOLN
100.0000 mL | Freq: Once | INTRAMUSCULAR | Status: AC | PRN
  Administered 2011-07-29: 100 mL via INTRAVENOUS

## 2011-07-29 MED ORDER — NAPROXEN 250 MG PO TABS: 250.0000 mg | ORAL_TABLET | Freq: Two times a day (BID) | ORAL | Status: DC

## 2011-07-29 MED ORDER — ONDANSETRON HCL 4 MG/2ML IJ SOLN
4.0000 mg | Freq: Once | INTRAMUSCULAR | Status: AC
  Administered 2011-07-29: 4 mg via INTRAVENOUS
  Filled 2011-07-29 (×2): qty 2

## 2011-07-29 NOTE — ED Provider Notes (Signed)
History     CSN: [id number]  Arrival date & time 07/29/11  1449      Chief Complaint  Patient presents with  . Abdominal Pain  . Back Pain    HPI Pt was seen at 1520.  Per pt, c/o gradual onset and persistence of waxing and waning lower abd "pain" and right sided lower back "pain" for the past month, worse over the past 2 weeks.  Describes the pain as "muscle aches."  Has been eval by [facility] ED last week for same, and was told her urine and STD check was "negative."  Pain increases with body position change.  Denies vaginal bleeding/discharge, no dysuria, no N/V/D, no rash, no fevers, no injury, no tingling/numbness in extremities, no focal motor weakness.      Past Medical History  Diagnosis Date  . Eczema     Past Surgical History  Procedure Date  . Cesarean section     History  Substance Use Topics  . Smoking status: Never Smoker   . Smokeless tobacco: Not on file  . Alcohol Use: No    OB History    Grav Para Term Preterm Abortions TAB SAB Ect Mult Living   2 1 1  0 1 0 1 0 0 1      Review of Systems ROS: Statement: All systems negative except as marked or noted in the HPI; Constitutional: Negative for fever and chills. ; ; Eyes: Negative for eye pain, redness and discharge. ; ; ENMT: Negative for ear pain, hoarseness, nasal congestion, sinus pressure and sore throat. ; ; Cardiovascular: Negative for chest pain, palpitations, diaphoresis, dyspnea and peripheral edema. ; ; Respiratory: Negative for cough, wheezing and stridor. ; ; Gastrointestinal: +abd pain.  Negative for nausea, vomiting, diarrhea, blood in stool, hematemesis, jaundice and rectal bleeding. . ; ; Genitourinary: Negative for dysuria, flank pain and hematuria. ; GYN:  No vaginal bleeding, no vaginal discharge, no vulvar pain.; Musculoskeletal: +right sided back pain.  Negative for neck pain. Negative for swelling and trauma.; ; Skin: Negative for pruritus, rash, abrasions, blisters, bruising and  skin lesion.; ; Neuro: Negative for headache, lightheadedness and neck stiffness. Negative for weakness, altered level of consciousness , altered mental status, extremity weakness, paresthesias, involuntary movement, seizure and syncope.     Allergies  Almond extract  Home Medications   Current Outpatient Rx  Name Route Sig Dispense Refill  . ACETAMINOPHEN 500 MG PO TABS Oral Take 500 mg by mouth every 6 (six) hours as needed. For pain    . DESOGESTREL-ETHINYL ESTRADIOL 0.15-30 MG-MCG PO TABS Oral Take 1 tablet by mouth daily.      Marland Kitchen HALOBETASOL PROPIONATE 0.05 % EX OINT Topical Apply 1 application topically daily. For eczema     . RANITIDINE HCL 150 MG PO TABS Oral Take 150 mg by mouth 2 (two) times daily. For heartburn       LMP 06/16/2011 BP 118/73  Pulse 99  Temp(Src) 99.9 F (37.7 C) (Oral)  Resp 20  SpO2 100%  LMP 06/16/2011    Physical Exam 1525: Physical examination:  Nursing notes reviewed; Vital signs and O2 SAT reviewed;  Constitutional: Well developed, Well nourished, Well hydrated, In no acute distress; Head:  Normocephalic, atraumatic; Eyes: EOMI, PERRL, No scleral icterus; ENMT: Mouth and pharynx normal, Mucous membranes moist; Neck: Supple, Full range of motion, No lymphadenopathy; Cardiovascular: Regular rate and rhythm, No murmur, rub, or gallop; Respiratory: Breath sounds clear & equal bilaterally, No rales, rhonchi, wheezes, or  rub, Normal respiratory effort/excursion; Chest: Nontender, Movement normal; Abdomen: Soft, +mild diffuse TTP, no rebound or guarding, Nondistended, Normal bowel sounds; Genitourinary: No CVA tenderness;  Spine:  No midline CS, TS, LS tenderness.  +TTP right lower lumbar paraspinal muscles.  No rash.;  Extremities: Pulses normal, No tenderness, No edema, No calf edema or asymmetry.; Neuro: AA&Ox3, Major CN grossly intact. Speech clear, no facial droop, normal coordination, gait steady. No gross focal motor or sensory deficits in extremities.;  Skin: Color normal, Warm, Dry, no rash.    ED Course  Procedures   3:44 PM:  Pt seen by [facility] last week for same with negative work up.  Pt requests NOT have another pelvic exam performed today.  Will proceed with UA/P, labs, CT A/P; pt agreeable with this plan.     MDM  MDM Reviewed: previous chart, nursing note and vitals Reviewed previous: labs Interpretation: labs and CT scan    Results for orders placed during the hospital encounter of 07/29/11  CBC      Component Value Range   WBC 8.6  4.0 - 10.5 (K/uL)   RBC 4.86  3.87 - 5.11 (MIL/uL)   Hemoglobin 13.5  12.0 - 15.0 (g/dL)   HCT 16.1  09.6 - 04.5 (%)   MCV 84.0  78.0 - 100.0 (fL)   MCH 27.8  26.0 - 34.0 (pg)   MCHC 33.1  30.0 - 36.0 (g/dL)   RDW 40.9  81.1 - 91.4 (%)   Platelets 318  150 - 400 (K/uL)  DIFFERENTIAL      Component Value Range   Neutrophils Relative 66  43 - 77 (%)   Neutro Abs 5.7  1.7 - 7.7 (K/uL)   Lymphocytes Relative 27  12 - 46 (%)   Lymphs Abs 2.3  0.7 - 4.0 (K/uL)   Monocytes Relative 5  3 - 12 (%)   Monocytes Absolute 0.4  0.1 - 1.0 (K/uL)   Eosinophils Relative 2  0 - 5 (%)   Eosinophils Absolute 0.2  0.0 - 0.7 (K/uL)   Basophils Relative 0  0 - 1 (%)   Basophils Absolute 0.0  0.0 - 0.1 (K/uL)  COMPREHENSIVE METABOLIC PANEL      Component Value Range   Sodium 138  135 - 145 (mEq/L)   Potassium 3.9  3.5 - 5.1 (mEq/L)   Chloride 104  96 - 112 (mEq/L)   CO2 23  19 - 32 (mEq/L)   Glucose, Bld 91  70 - 99 (mg/dL)   BUN 11  6 - 23 (mg/dL)   Creatinine, Ser 7.82  0.50 - 1.10 (mg/dL)   Calcium 9.8  8.4 - 95.6 (mg/dL)   Total Protein 8.3  6.0 - 8.3 (g/dL)   Albumin 3.7  3.5 - 5.2 (g/dL)   AST 22  0 - 37 (U/L)   ALT 10  0 - 35 (U/L)   Alkaline Phosphatase 69  39 - 117 (U/L)   Total Bilirubin 0.3  0.3 - 1.2 (mg/dL)   GFR calc non Af Amer >90  >90 (mL/min)   GFR calc Af Amer >90  >90 (mL/min)  LIPASE, BLOOD      Component Value Range   Lipase 22  11 - 59 (U/L)  URINALYSIS,  ROUTINE W REFLEX MICROSCOPIC      Component Value Range   Color, Urine YELLOW  YELLOW    APPearance CLOUDY (*) CLEAR    Specific Gravity, Urine 1.025  1.005 - 1.030  pH 6.0  5.0 - 8.0    Glucose, UA NEGATIVE  NEGATIVE (mg/dL)   Hgb urine dipstick NEGATIVE  NEGATIVE    Bilirubin Urine NEGATIVE  NEGATIVE    Ketones, ur NEGATIVE  NEGATIVE (mg/dL)   Protein, ur NEGATIVE  NEGATIVE (mg/dL)   Urobilinogen, UA 0.2  0.0 - 1.0 (mg/dL)   Nitrite NEGATIVE  NEGATIVE    Leukocytes, UA SMALL (*) NEGATIVE   POCT PREGNANCY, URINE      Component Value Range   Preg Test, Ur NEGATIVE    URINE MICROSCOPIC-ADD ON      Component Value Range   Squamous Epithelial / LPF FEW (*) RARE    WBC, UA 0-2  <3 (WBC/hpf)   Bacteria, UA RARE  RARE    Urine-Other MUCOUS PRESENT     Ct Abdomen Pelvis W Contrast 07/29/2011  *RADIOLOGY REPORT*  Clinical Data: Lower abdominal and right side back pain, nausea  CT ABDOMEN AND PELVIS WITH CONTRAST  Technique:  Multidetector CT imaging of the abdomen and pelvis was performed following the standard protocol during bolus administration of intravenous contrast.  Sagittal and coronal MPR images reconstructed from axial data set.  Contrast: OMNIPAQUE IOHEXOL 300 MG/ML IV SOLN Dilute oral contrast.  Comparison: 02/22/2006  Findings: Lung bases clear. Tiny nonspecific low attenuation focus right lobe liver image 22. Liver, spleen, pancreas, kidneys, and adrenal glands normal. Slight swirling of vessels in mesentery raising question of partial small bowel malrotation. No bowel dilatation, bowel wall thickening, or evidence of obstruction. Normal appendix. Unremarkable bladder, ureters, uterus, and adnexae. No mass, adenopathy, free fluid, or inflammatory process. No acute osseous findings.  IMPRESSION: No acute intra abdominal or intrapelvic abnormalities.  Original Report Authenticated By: Lollie Marrow, M.D.     6:49 PM:   Pt continues to refuse repeat pelvic exam.  GC/chlam and  wet prep from last week were negative for acute infection.  Appears msk pain at this time.  Appears comfortable after toradol.  Pt wants to go home now.  Dx testing d/w pt.  Questions answered.  Verb understanding, agreeable to d/c home with outpt f/u.      Maleny Candy Allison Quarry, DO 07/31/11 1557

## 2011-07-29 NOTE — ED Notes (Signed)
Pt seen last week at [facility] hospital. Worked up for UTI, STD, without abnormal findings. Pt states the muscle aches and generalized fatigue still persists. Denies dysuria, nausea or vomiting. Pt also denies diarrhea. Pt states she has back pain and kidney pain.

## 2011-10-15 ENCOUNTER — Encounter (HOSPITAL_COMMUNITY): Payer: Self-pay | Admitting: *Deleted

## 2011-10-15 ENCOUNTER — Emergency Department (HOSPITAL_COMMUNITY)
Admission: EM | Admit: 2011-10-15 | Discharge: 2011-10-15 | Disposition: A | Discharge status: Home / Self Care | Payer: Self-pay | Attending: Emergency Medicine | Admitting: Emergency Medicine

## 2011-10-15 DIAGNOSIS — R10819 Abdominal tenderness, unspecified site: Secondary | ICD-10-CM | POA: Insufficient documentation

## 2011-10-15 DIAGNOSIS — R110 Nausea: Secondary | ICD-10-CM | POA: Insufficient documentation

## 2011-10-15 DIAGNOSIS — R197 Diarrhea, unspecified: Secondary | ICD-10-CM | POA: Insufficient documentation

## 2011-10-15 DIAGNOSIS — R109 Unspecified abdominal pain: Secondary | ICD-10-CM | POA: Insufficient documentation

## 2011-10-15 HISTORY — DX: Bursopathy, unspecified: M71.9

## 2011-10-15 HISTORY — DX: Unspecified osteoarthritis, unspecified site: M19.90

## 2011-10-15 HISTORY — DX: Dysmenorrhea, unspecified: N94.6

## 2011-10-15 LAB — URINALYSIS, ROUTINE W REFLEX MICROSCOPIC
Bilirubin Urine: NEGATIVE
Hgb urine dipstick: NEGATIVE
Nitrite: NEGATIVE
Protein, ur: NEGATIVE mg/dL
Specific Gravity, Urine: 1.027 (ref 1.005–1.030)
Urobilinogen, UA: 0.2 mg/dL (ref 0.0–1.0)

## 2011-10-15 LAB — URINE MICROSCOPIC-ADD ON

## 2011-10-15 LAB — POCT I-STAT, CHEM 8
Creatinine, Ser: 0.8 mg/dL (ref 0.50–1.10)
HCT: 42 % (ref 36.0–46.0)
Hemoglobin: 14.3 g/dL (ref 12.0–15.0)
Potassium: 3.9 mEq/L (ref 3.5–5.1)
Sodium: 140 mEq/L (ref 135–145)
TCO2: 21 mmol/L (ref 0–100)

## 2011-10-15 LAB — POCT PREGNANCY, URINE: Preg Test, Ur: NEGATIVE

## 2011-10-15 MED ORDER — METOCLOPRAMIDE HCL 5 MG/ML IJ SOLN
10.0000 mg | Freq: Once | INTRAMUSCULAR | Status: AC
  Administered 2011-10-15: 10 mg via INTRAVENOUS
  Filled 2011-10-15: qty 2

## 2011-10-15 MED ORDER — MORPHINE SULFATE 4 MG/ML IJ SOLN
4.0000 mg | Freq: Once | INTRAMUSCULAR | Status: AC
  Administered 2011-10-15: 4 mg via INTRAVENOUS
  Filled 2011-10-15: qty 1

## 2011-10-15 MED ORDER — DIPHENOXYLATE-ATROPINE 2.5-0.025 MG PO TABS
1.0000 | ORAL_TABLET | Freq: Once | ORAL | Status: AC
  Administered 2011-10-15: 1 via ORAL
  Filled 2011-10-15: qty 1

## 2011-10-15 MED ORDER — DIPHENOXYLATE-ATROPINE 2.5-0.025 MG PO TABS: 1.0000 | ORAL_TABLET | Freq: Four times a day (QID) | ORAL | Status: AC | PRN

## 2011-10-15 MED ORDER — SODIUM CHLORIDE 0.9 % IV BOLUS (SEPSIS)
1000.0000 mL | Freq: Once | INTRAVENOUS | Status: AC
  Administered 2011-10-15: 1000 mL via INTRAVENOUS

## 2011-10-15 NOTE — ED Provider Notes (Signed)
History     CSN: [id number]  Arrival date & time 10/15/11  1137   None     Chief Complaint  Patient presents with  . Nausea  . Abdominal Pain  . Diarrhea    (Consider location/radiation/quality/duration/timing/severity/associated sxs/prior treatment) Patient is a 21 y.o. female presenting with abdominal pain and diarrhea. The history is provided by the patient and the spouse. No language interpreter was used.  Abdominal Pain The primary symptoms of the illness include abdominal pain, nausea and diarrhea. The primary symptoms of the illness do not include shortness of breath, vomiting or dysuria. The current episode started more than 2 days ago. The onset of the illness was gradual. The problem has been gradually worsening.  Symptoms associated with the illness do not include diaphoresis, constipation, urgency, hematuria, frequency or back pain.  Diarrhea The primary symptoms include abdominal pain, nausea and diarrhea. Primary symptoms do not include vomiting or dysuria.  The illness does not include constipation or back pain.    Past Medical History  Diagnosis Date  . Eczema   . Bursitis   . Arthritis   . Dysmenorrhea     Past Surgical History  Procedure Date  . Cesarean section     No family history on file.  History  Substance Use Topics  . Smoking status: Never Smoker   . Smokeless tobacco: Not on file  . Alcohol Use: No    OB History    Grav Para Term Preterm Abortions TAB SAB Ect Mult Living   2 1 1  0 1 0 1 0 0 1      Review of Systems  Constitutional: Negative for diaphoresis.  Respiratory: Negative for shortness of breath.   Gastrointestinal: Positive for nausea, abdominal pain and diarrhea. Negative for vomiting and constipation.  Genitourinary: Negative for dysuria, urgency, frequency and hematuria.  Musculoskeletal: Negative for back pain.    Allergies  Almond extract and Zofran  Home Medications   Current Outpatient Rx  Name Route Sig  Dispense Refill  . BISMUTH SUBSALICYLATE 262 MG PO CHEW Oral Chew 524 mg by mouth as needed. For nausea and vomiting    . DESOGESTREL-ETHINYL ESTRADIOL 0.15-30 MG-MCG PO TABS Oral Take 1 tablet by mouth daily.      Marland Kitchen HALOBETASOL PROPIONATE 0.05 % EX OINT Topical Apply 1 application topically daily. For eczema     . RANITIDINE HCL 150 MG PO TABS Oral Take 150 mg by mouth 2 (two) times daily. For heartburn       BP 109/65  Pulse 90  Temp(Src) 99.2 F (37.3 C) (Oral)  Resp 16  SpO2 99%  LMP 09/24/2011  Physical Exam  Nursing note and vitals reviewed. Constitutional: She is oriented to person, place, and time. She appears well-developed and well-nourished.  HENT:  Head: Normocephalic and atraumatic.  Eyes: Conjunctivae and EOM are normal. Pupils are equal, round, and reactive to light.  Neck: Normal range of motion. Neck supple.  Cardiovascular: Normal rate.   Pulmonary/Chest: Effort normal.  Abdominal: Soft. She exhibits no distension and no mass. There is tenderness. There is no rebound and no guarding.  Musculoskeletal: Normal range of motion. She exhibits no edema and no tenderness.  Neurological: She is alert and oriented to person, place, and time. She has normal reflexes.  Skin: Skin is warm and dry.  Psychiatric: She has a normal mood and affect.    ED Course  Procedures (including critical care time)  Labs Reviewed  URINALYSIS, ROUTINE W REFLEX MICROSCOPIC -  Abnormal; Notable for the following:    APPearance CLOUDY (*)    Ketones, ur TRACE (*)    Leukocytes, UA TRACE (*)    All other components within normal limits  URINE MICROSCOPIC-ADD ON - Abnormal; Notable for the following:    Squamous Epithelial / LPF MANY (*)    Bacteria, UA MANY (*)    All other components within normal limits  POCT I-STAT, CHEM 8  POCT PREGNANCY, URINE   No results found.   No diagnosis found.    MDM  IV fluids, lomotil, morphine and reglan as treatment for diarrhea x 2 days .   Ready for discharge. rx for  Lomotil.  Return if worse.         Jethro Bastos, NP 10/15/11 [phone]

## 2011-10-15 NOTE — ED Notes (Signed)
Pt reports nausea and diarrhea with stomach cramping in lower and upper abdomen. Pt has tried pepto bismol for symptoms without relief.  Pt denies vomiting.  Pt stays symptoms started with feeling like she was having a cold with body aches, Pt reports body aches improved and then her stomach began to hurt. Pt has had 3 episodes of diarrhea today. Pt denies painful urination or urinary issues. Pt denies vaginal d/c. Pt denies blood in stool.

## 2011-10-15 NOTE — Discharge Instructions (Signed)
[facility]y [facility] we gave you fluids in [facility] ER today and texture labs. Becky labs were normal today. We also gave you Lomotil for your diarrhea. Morphine was given for Becky abdominal cramping. Return to [facility] ER if you have high fever uncontrolled nausea vomiting and diarrhea. Followup with a PCP of your choice or one from Becky list below this week.  RESOURCE GUIDE  Dental Problems  Patients with Medicaid: [facility]y [facility] [phone]                                           [phone] [address] Phone:  [phone]                                                  Phone:  [phone]  If unable to pay or uninsured, contact:  Becky Young or Becky Young. to become qualified for Becky adult dental Young.  Chronic Pain Problems Contact Becky Young  [phone] Patients need to be referred by their primary care doctor.  Insufficient Money for Young Contact Becky Young:  call "211" or [facility] [phone].  No Primary Care Doctor Call [facility]  [phone] Other agencies that provide inexpensive medical care    [facility]  [phone]    [facility]  [phone]    [facility]  [phone]    Becky Young  [phone]    [facility]y Young  [phone]    [facility]  [phone]  Psychological Young [facility]  [phone] [facility]  [phone] Becky Young   [phone] (emergency Young [phone])  Substance Abuse Resources Becky Young  [phone] [facility] [phone] [facility] [phone] [facility] [phone] [facility]  [phone]  Abuse/Neglect [facility] [phone] [facility] [phone] (After Hours)  Emergency Shelter [facility] [phone]  Maternity  [facility] [phone] [facility] [phone]  Becky Young #:   [phone]    [place] [facility]  Becky Young     [facility]y [facility]                          [facility] [place] [facility] [address] [place]                       [address]      [address]  Becky Young                                                Becky Young Phone:  [phone]  Phone:  [phone]                 Phone:  [phone]  Becky Young Phone:  [phone]  Becky Young [phone] (After Hours)   Becky Young Your doctor has recommended Becky [facility] Young for you or your child until Becky condition improves. This is often used to help control diarrhea and vomiting symptoms. If you or your child can tolerate clear liquids, you may have:  Bananas.   Rice.   Applesauce.   Toast (and other simple starches such as crackers, potatoes, noodles).  Be sure to avoid dairy products, meats, and fatty foods until symptoms are better. Fruit juices such as apple, grape, and prune juice can make diarrhea worse. Avoid these. Continue this Young for 2 days or as instructed by your caregiver. Document Released: 07/03/2005 Document Revised: 06/22/2011 Document Reviewed: 12/20/2006   Becky Young Your doctor has recommended Becky Becky Young for you or your child until Becky condition improves. This is often used to help control diarrhea and vomiting symptoms. If you or your child can tolerate clear liquids, you may have:  Bananas.   Rice.   Applesauce.   Toast (and other simple starches such as crackers, potatoes, noodles).  Be sure to avoid dairy products, meats, and fatty foods until symptoms are better. Fruit juices such as apple, grape, and prune juice can make diarrhea worse. Avoid these. Continue this  Young for 2 days or as instructed by your caregiver. Document Released: 07/03/2005 Document Revised: 06/22/2011 Document Reviewed: 12/20/2006 [facility] Patient Information 2012 [place], [place].

## 2011-10-17 NOTE — ED Provider Notes (Signed)
Medical screening examination/treatment/procedure(s) were performed by non-physician practitioner and as supervising physician I was immediately available for consultation/collaboration.  Niala Stcharles T Oasis Goehring, MD 10/17/11 0811 

## 2012-03-23 ENCOUNTER — Inpatient Hospital Stay (HOSPITAL_COMMUNITY)
Admission: AD | Admit: 2012-03-23 | Discharge: 2012-03-23 | Disposition: A | Discharge status: Home / Self Care | Payer: Self-pay | Source: Ambulatory Visit | Attending: Obstetrics and Gynecology | Admitting: Obstetrics and Gynecology

## 2012-03-23 DIAGNOSIS — R109 Unspecified abdominal pain: Secondary | ICD-10-CM | POA: Insufficient documentation

## 2012-03-23 DIAGNOSIS — R141 Gas pain: Secondary | ICD-10-CM

## 2012-03-23 DIAGNOSIS — Z3202 Encounter for pregnancy test, result negative: Secondary | ICD-10-CM | POA: Insufficient documentation

## 2012-03-23 LAB — URINALYSIS, ROUTINE W REFLEX MICROSCOPIC
Glucose, UA: NEGATIVE mg/dL
Leukocytes, UA: NEGATIVE
Nitrite: NEGATIVE
Specific Gravity, Urine: >1.03 — ABNORMAL HIGH (ref 1.005–1.030)
pH: 6 (ref 5.0–8.0)

## 2012-03-23 NOTE — MAU Provider Note (Signed)
Chief Complaint: Abdominal Pain   First Provider Initiated Contact with Patient 03/23/12 2159     SUBJECTIVE HPI: Becky Young is a 21 y.o. G2P1011 who presents to MAU reporting feeling flutters throughout her abdomen times several months. She has regular menstrual cycles but is concerned that she may be pregnant. Negative home UPT. On birth control pills until last month. She stopped them to see if the symptoms were caused by birth control pills, but has had no change in the symptoms since stopping them. The sensations are sometimes painful, intermittent and colicky and occur throughout the entire abdomen. She denies fever, chills, urinary symptoms, vaginal discharge, intermenstrual spotting. She has bowel movements possibly every other day, but states this is no change from her usual bowel habits. She also states she has been evaluated by her primary care physician and in the emergency room for the same complaints and had a barium swallow did not reveal the etiology for the sensations.   Past Medical History  Diagnosis Date  . Eczema   . Bursitis   . Arthritis   . Dysmenorrhea    OB History    Grav Para Term Preterm Abortions TAB SAB Ect Mult Living   2 1 1  0 1 0 1 0 0 1     # Outc Date GA Lbr Len/2nd Wgt Sex Del Anes PTL Lv   1 TRM     M LTCS   Yes   Comments: breech   2 SAB              Past Surgical History  Procedure Date  . Cesarean section    History   Social History  . Marital Status: Single    Spouse Name: N/A    Number of Children: N/A  . Years of Education: N/A   Occupational History  . Not on file.   Social History Main Topics  . Smoking status: Never Smoker   . Smokeless tobacco: Not on file  . Alcohol Use: No  . Drug Use: No  . Sexually Active: Yes    Birth Control/ Protection: Pill     depo provera   Other Topics Concern  . Not on file   Social History Narrative  . No narrative on file   No current facility-administered medications on file prior  to encounter.   Current Outpatient Prescriptions on File Prior to Encounter  Medication Sig Dispense Refill  . desogestrel-ethinyl estradiol (APRI,EMOQUETTE,SOLIA) 0.15-30 MG-MCG tablet Take 1 tablet by mouth daily.        . halobetasol (ULTRAVATE) 0.05 % ointment Apply 1 application topically daily. For eczema       . ranitidine (ZANTAC) 150 MG tablet Take 150 mg by mouth 3 (three) times daily. For heartburn       Allergies  Allergen Reactions  . Almond Extract Shortness Of Breath and Swelling  . Zofran     Pt unsure, but was given medication for nausea and developed hives. She believes it was zofran    ROS: Pertinent items in HPI  OBJECTIVE Blood pressure 125/78, pulse 95, temperature 99.1 F (37.3 C), temperature source Oral, resp. rate 18, height 5' 3.5" (1.613 m), weight 75.66 kg (166 lb 12.8 oz), last menstrual period 03/11/2012. GENERAL: Well-developed, well-nourished female in mild distress.  HEENT: Normocephalic HEART: normal rate RESP: normal effort ABDOMEN: Moderately distended, non-tender. Positive bowel sounds x4. EXTREMITIES: Nontender, no edema NEURO: Alert and oriented SPECULUM EXAM: Deferred  LAB RESULTS Results for orders placed during  the hospital encounter of 03/23/12 (from the past 24 hour(s))  URINALYSIS, ROUTINE W REFLEX MICROSCOPIC     Status: Abnormal   Collection Time   03/23/12  8:15 PM      Component Value Range   Color, Urine YELLOW  YELLOW   APPearance CLEAR  CLEAR   Specific Gravity, Urine >1.030 (*) 1.005 - 1.030   pH 6.0  5.0 - 8.0   Glucose, UA NEGATIVE  NEGATIVE mg/dL   Hgb urine dipstick NEGATIVE  NEGATIVE   Bilirubin Urine NEGATIVE  NEGATIVE   Ketones, ur NEGATIVE  NEGATIVE mg/dL   Protein, ur NEGATIVE  NEGATIVE mg/dL   Urobilinogen, UA 0.2  0.0 - 1.0 mg/dL   Nitrite NEGATIVE  NEGATIVE   Leukocytes, UA NEGATIVE  NEGATIVE  POCT PREGNANCY, URINE     Status: Normal   Collection Time   03/23/12  8:21 PM      Component Value Range   Preg  Test, Ur NEGATIVE  NEGATIVE   IMAGING No results found.  ED COURSE  ASSESSMENT 1. Abdominal gas pain    PLAN Discharge home  MiraLAX and Gas-X Reduce gas producing foods. Follow-up Information    Follow up with [facility].   Contact information:   [address] [place] [address] [phone]          Medication List     As of 04/04/2012 12:53 AM    TAKE these medications         desogestrel-ethinyl estradiol 0.15-30 MG-MCG tablet   Commonly known as: APRI,EMOQUETTE,SOLIA   Take 1 tablet by mouth daily.      halobetasol 0.05 % ointment   Commonly known as: ULTRAVATE   Apply 1 application topically daily. For eczema      ranitidine 150 MG tablet   Commonly known as: ZANTAC   Take 150 mg by mouth 3 (three) times daily. For heartburn         [place] 03/23/2012  10:19 PM

## 2012-03-23 NOTE — MAU Note (Signed)
Pt presents with abdominal pain.  Pt had a stomach virus for 26 days around Easter.  Pt states she and 10 other people have been feeling flutter for 3 weeks.  Pt states her home pregnancy tests are negative and wanted a pregnancy test here or to be checked for a tapeworm.  Pt states pain is a 10 /10 when it occurs.

## 2012-03-23 NOTE — MAU Note (Signed)
Lower abdominal pain that spreads up to her ribs since April. UPT at home was negative but she states they were negative with her last child. Pt states she wants to be checked out for a tape worm. Denies vaginal bleeding. Constant nausea.

## 2012-06-03 ENCOUNTER — Emergency Department (INDEPENDENT_AMBULATORY_CARE_PROVIDER_SITE_OTHER)
Admission: EM | Admit: 2012-06-03 | Discharge: 2012-06-03 | Disposition: A | Discharge status: Home / Self Care | Payer: Self-pay | Source: Home / Self Care

## 2012-06-03 ENCOUNTER — Encounter (HOSPITAL_COMMUNITY): Payer: Self-pay | Admitting: Emergency Medicine

## 2012-06-03 DIAGNOSIS — R102 Pelvic and perineal pain unspecified side: Secondary | ICD-10-CM

## 2012-06-03 DIAGNOSIS — K219 Gastro-esophageal reflux disease without esophagitis: Secondary | ICD-10-CM

## 2012-06-03 DIAGNOSIS — N3289 Other specified disorders of bladder: Secondary | ICD-10-CM

## 2012-06-03 DIAGNOSIS — B9689 Other specified bacterial agents as the cause of diseases classified elsewhere: Secondary | ICD-10-CM

## 2012-06-03 DIAGNOSIS — N760 Acute vaginitis: Secondary | ICD-10-CM

## 2012-06-03 DIAGNOSIS — N949 Unspecified condition associated with female genital organs and menstrual cycle: Secondary | ICD-10-CM

## 2012-06-03 DIAGNOSIS — B3731 Acute candidiasis of vulva and vagina: Secondary | ICD-10-CM

## 2012-06-03 DIAGNOSIS — K5909 Other constipation: Secondary | ICD-10-CM

## 2012-06-03 DIAGNOSIS — B373 Candidiasis of vulva and vagina: Secondary | ICD-10-CM

## 2012-06-03 LAB — POCT URINALYSIS DIP (DEVICE)
Glucose, UA: NEGATIVE mg/dL
Ketones, ur: 15 mg/dL — AB
Leukocytes, UA: NEGATIVE
Nitrite: POSITIVE — AB

## 2012-06-03 LAB — POCT PREGNANCY, URINE: Preg Test, Ur: NEGATIVE

## 2012-06-03 MED ORDER — FLUCONAZOLE 150 MG PO TABS: 150.0000 mg | ORAL_TABLET | Freq: Once | ORAL | Status: DC

## 2012-06-03 MED ORDER — METRONIDAZOLE 250 MG PO TABS: 250.0000 mg | ORAL_TABLET | Freq: Two times a day (BID) | ORAL | Status: DC

## 2012-06-03 MED ORDER — POLYETHYLENE GLYCOL 3350 17 GM/SCOOP PO POWD: 17.0000 g | Freq: Every day | ORAL | Status: DC

## 2012-06-03 MED ORDER — GI COCKTAIL ~~LOC~~
ORAL | Status: AC
  Filled 2012-06-03: qty 30

## 2012-06-03 MED ORDER — ACIDOPHILUS PROBIOTIC 100 MG PO CAPS: 1.0000 | ORAL_CAPSULE | Freq: Three times a day (TID) | ORAL | Status: DC

## 2012-06-03 MED ORDER — GI COCKTAIL ~~LOC~~
30.0000 mL | Freq: Once | ORAL | Status: AC
  Administered 2012-06-03: 30 mL via ORAL

## 2012-06-03 NOTE — ED Notes (Signed)
Pt here with c/o lower abdominal pain radiating to both sides since March 2013 with many workups and abdominal xrays @ [place] [facility].states test have been negative.normal menstrual periods and last bm this am.denies nausea and vomiting but c/o certain smell of meats cooking.LMP 05/04/12.Also reports of ending oral  b/c pills 2 mnths ago but denies bleeding or cramping.recent vaginal d/c without odor or irritation

## 2012-06-03 NOTE — ED Provider Notes (Signed)
History     CSN: [id number]  Arrival date & time 06/03/12  1734   First MD Initiated Contact with Patient 06/03/12 1813      Chief Complaint  Patient presents with  . Pelvic Pain  . Abdominal Pain    (Consider location/radiation/quality/duration/timing/severity/associated sxs/prior treatment) HPI Pt reporting chronic problem of pelvic, vaginal pain, small clear vaginal discharge for 1 week, constipation, no fever or chills or nausea or vomiting.  Pt reporting that she stopped her oral contraceptives several months ago.  She reports that she has been to multiple ERs and reporting no dysuria.    Past Medical History  Diagnosis Date  . Eczema   . Bursitis   . Arthritis   . Dysmenorrhea     Past Surgical History  Procedure Date  . Cesarean section     History reviewed. No pertinent family history.  History  Substance Use Topics  . Smoking status: Never Smoker   . Smokeless tobacco: Not on file  . Alcohol Use: No    OB History    Grav Para Term Preterm Abortions TAB SAB Ect Mult Living   2 1 1  0 1 0 1 0 0 1      Review of Systems  Constitutional: Positive for fatigue.  HENT: Negative.   Eyes: Negative.   Respiratory: Negative.   Cardiovascular: Negative.   Gastrointestinal: Positive for abdominal distention.  Genitourinary: Positive for frequency, flank pain, vaginal discharge and pelvic pain. Negative for dysuria, urgency, decreased urine volume, enuresis and difficulty urinating.  Musculoskeletal: Negative.   Neurological: Negative.   Hematological: Negative.   Psychiatric/Behavioral: Negative.   All other systems reviewed and are negative.    Allergies  Almond extract and Zofran  Home Medications   Current Outpatient Rx  Name  Route  Sig  Dispense  Refill  . DESOGESTREL-ETHINYL ESTRADIOL 0.15-30 MG-MCG PO TABS   Oral   Take 1 tablet by mouth daily.           Marland Kitchen HALOBETASOL PROPIONATE 0.05 % EX OINT   Topical   Apply 1 application topically  daily. For eczema          . RANITIDINE HCL 150 MG PO TABS   Oral   Take 150 mg by mouth 3 (three) times daily. For heartburn           BP 110/67  Pulse 82  Temp 98.4 F (36.9 C) (Oral)  Resp 14  SpO2 99%  LMP 05/04/2012  Physical Exam  Constitutional: She appears well-developed and well-nourished.  HENT:  Head: Normocephalic and atraumatic.  Mouth/Throat: No oropharyngeal exudate.  Eyes: Conjunctivae normal and EOM are normal. Pupils are equal, round, and reactive to light.  Neck: Normal range of motion. Neck supple.  Cardiovascular: Normal rate, regular rhythm and normal heart sounds.   Pulmonary/Chest: Effort normal and breath sounds normal. No respiratory distress. She has no wheezes.  Abdominal: Soft. Bowel sounds are normal. She exhibits no distension. There is no tenderness.  Genitourinary: No erythema or tenderness around the vagina. No signs of injury around the vagina. Vaginal discharge found.  Musculoskeletal: Normal range of motion.  Skin: Skin is warm and dry. Rash noted. No erythema.  Psychiatric: She has a normal mood and affect. Her behavior is normal. Thought content normal.    ED Course  Procedures (including critical care time)  Labs Reviewed  POCT URINALYSIS DIP (DEVICE) - Abnormal; Notable for the following:    Bilirubin Urine SMALL (*)  Ketones, ur 15 (*)     Nitrite POSITIVE (*)     All other components within normal limits  POCT PREGNANCY, URINE   No results found.   No diagnosis found.   MDM  Pelvic Pain Bladder Spasms Abnormal UA Vaginal Discharge Constipation - Chronic  Will start pt on oral laxatives - recommended Miralax daily, plus probiotics Will empirically treat for clue cells (bacterial vaginosis) as wet prep is pending with metronidazole daily for 7 days Will empirically treat with fluconazole 150 mg Asked pt to call and schedule appt with her gynecologist ASAP Dairy - free diet for 1 month  Return or go to ER if  symptoms worsen or don't improve or new problems develop.          Cleora Fleet, MD 06/03/12 2009

## 2012-06-04 ENCOUNTER — Encounter: Payer: Self-pay | Admitting: Family Medicine

## 2012-06-04 ENCOUNTER — Telehealth (HOSPITAL_COMMUNITY): Payer: Self-pay | Admitting: Family Medicine

## 2012-06-04 DIAGNOSIS — E739 Lactose intolerance, unspecified: Secondary | ICD-10-CM | POA: Insufficient documentation

## 2012-06-04 DIAGNOSIS — K5909 Other constipation: Secondary | ICD-10-CM | POA: Insufficient documentation

## 2012-06-04 LAB — URINE CULTURE: Colony Count: 30000

## 2012-06-04 LAB — GC/CHLAMYDIA PROBE AMP
CT Probe RNA: NEGATIVE
GC Probe RNA: NEGATIVE

## 2012-06-04 NOTE — ED Notes (Signed)
I attempted to call but got a voice message saying the person is not accepting calls at this time   Cleora Fleet, MD, CDE, FAAFP [facility] [place], [place]    Cleora Fleet, MD 06/04/12 [phone]

## 2012-06-05 NOTE — ED Notes (Signed)
Pt notified with normal GC/CHLAMYDIA RESULTS @ 725PM

## 2012-07-05 ENCOUNTER — Encounter (HOSPITAL_COMMUNITY): Payer: Self-pay | Admitting: *Deleted

## 2012-07-05 ENCOUNTER — Emergency Department (HOSPITAL_COMMUNITY)
Admission: EM | Admit: 2012-07-05 | Discharge: 2012-07-06 | Disposition: A | Discharge status: Home / Self Care | Payer: Self-pay | Attending: Emergency Medicine | Admitting: Emergency Medicine

## 2012-07-05 DIAGNOSIS — K5900 Constipation, unspecified: Secondary | ICD-10-CM | POA: Insufficient documentation

## 2012-07-05 DIAGNOSIS — Z3202 Encounter for pregnancy test, result negative: Secondary | ICD-10-CM | POA: Insufficient documentation

## 2012-07-05 DIAGNOSIS — Z872 Personal history of diseases of the skin and subcutaneous tissue: Secondary | ICD-10-CM | POA: Insufficient documentation

## 2012-07-05 DIAGNOSIS — Z8742 Personal history of other diseases of the female genital tract: Secondary | ICD-10-CM | POA: Insufficient documentation

## 2012-07-05 DIAGNOSIS — Z8739 Personal history of other diseases of the musculoskeletal system and connective tissue: Secondary | ICD-10-CM | POA: Insufficient documentation

## 2012-07-05 DIAGNOSIS — Z862 Personal history of diseases of the blood and blood-forming organs and certain disorders involving the immune mechanism: Secondary | ICD-10-CM | POA: Insufficient documentation

## 2012-07-05 DIAGNOSIS — N390 Urinary tract infection, site not specified: Secondary | ICD-10-CM | POA: Insufficient documentation

## 2012-07-05 DIAGNOSIS — N949 Unspecified condition associated with female genital organs and menstrual cycle: Secondary | ICD-10-CM | POA: Insufficient documentation

## 2012-07-05 NOTE — ED Notes (Signed)
Pt c/o R lower back pain starting Wed and lower abdominal/pelvic pain x 6 months.

## 2012-07-06 LAB — COMPREHENSIVE METABOLIC PANEL WITH GFR
ALT: 12 U/L (ref 0–35)
AST: 28 U/L (ref 0–37)
Albumin: 3.9 g/dL (ref 3.5–5.2)
Alkaline Phosphatase: 67 U/L (ref 39–117)
BUN: 12 mg/dL (ref 6–23)
CO2: 21 meq/L (ref 19–32)
Calcium: 9.4 mg/dL (ref 8.4–10.5)
Chloride: 104 meq/L (ref 96–112)
Creatinine, Ser: 0.68 mg/dL (ref 0.50–1.10)
GFR calc Af Amer: >90 mL/min
GFR calc non Af Amer: >90 mL/min
Glucose, Bld: 97 mg/dL (ref 70–99)
Potassium: 5 meq/L (ref 3.5–5.1)
Sodium: 135 meq/L (ref 135–145)
Total Bilirubin: 0.2 mg/dL — ABNORMAL LOW (ref 0.3–1.2)
Total Protein: 7.6 g/dL (ref 6.0–8.3)

## 2012-07-06 LAB — CBC WITH DIFFERENTIAL/PLATELET
Basophils Absolute: 0 10*3/uL (ref 0.0–0.1)
Basophils Relative: 0 % (ref 0–1)
Eosinophils Absolute: 0.3 10*3/uL (ref 0.0–0.7)
Eosinophils Relative: 3 % (ref 0–5)
HCT: 37.6 % (ref 36.0–46.0)
Hemoglobin: 12.7 g/dL (ref 12.0–15.0)
Lymphocytes Relative: 31 % (ref 12–46)
Lymphs Abs: 3.3 10*3/uL (ref 0.7–4.0)
MCH: 28.2 pg (ref 26.0–34.0)
MCHC: 33.8 g/dL (ref 30.0–36.0)
MCV: 83.6 fL (ref 78.0–100.0)
Monocytes Absolute: 0.5 10*3/uL (ref 0.1–1.0)
Monocytes Relative: 5 % (ref 3–12)
Neutro Abs: 6.4 10*3/uL (ref 1.7–7.7)
Neutrophils Relative %: 61 % (ref 43–77)
Platelets: ADEQUATE 10*3/uL (ref 150–400)
RBC: 4.5 MIL/uL (ref 3.87–5.11)
RDW: 12.2 % (ref 11.5–15.5)
WBC: 10.5 10*3/uL (ref 4.0–10.5)

## 2012-07-06 LAB — URINE MICROSCOPIC-ADD ON

## 2012-07-06 LAB — URINALYSIS, ROUTINE W REFLEX MICROSCOPIC
Bilirubin Urine: NEGATIVE
Glucose, UA: NEGATIVE mg/dL
Hgb urine dipstick: NEGATIVE
Ketones, ur: NEGATIVE mg/dL
Nitrite: NEGATIVE
Protein, ur: NEGATIVE mg/dL
Specific Gravity, Urine: 1.025 (ref 1.005–1.030)
Urobilinogen, UA: 0.2 mg/dL (ref 0.0–1.0)
pH: 6.5 (ref 5.0–8.0)

## 2012-07-06 LAB — POCT PREGNANCY, URINE: Preg Test, Ur: NEGATIVE

## 2012-07-06 LAB — LIPASE, BLOOD: Lipase: 26 U/L (ref 11–59)

## 2012-07-06 MED ORDER — SODIUM CHLORIDE 0.9 % IV BOLUS (SEPSIS)
1000.0000 mL | Freq: Once | INTRAVENOUS | Status: AC
  Administered 2012-07-06: 1000 mL via INTRAVENOUS

## 2012-07-06 MED ORDER — GI COCKTAIL ~~LOC~~
30.0000 mL | Freq: Once | ORAL | Status: AC
  Administered 2012-07-06: 30 mL via ORAL
  Filled 2012-07-06: qty 30

## 2012-07-06 MED ORDER — METOCLOPRAMIDE HCL 5 MG/ML IJ SOLN
10.0000 mg | Freq: Once | INTRAMUSCULAR | Status: AC
  Administered 2012-07-06: 10 mg via INTRAVENOUS
  Filled 2012-07-06: qty 2

## 2012-07-06 MED ORDER — HYDROCODONE-ACETAMINOPHEN 5-325 MG PO TABS: 1.0000 | ORAL_TABLET | Freq: Four times a day (QID) | ORAL | Status: DC | PRN

## 2012-07-06 MED ORDER — CEFTRIAXONE SODIUM 1 G IJ SOLR
1.0000 g | Freq: Once | INTRAMUSCULAR | Status: AC
  Administered 2012-07-06: 1 g via INTRAVENOUS
  Filled 2012-07-06: qty 10

## 2012-07-06 MED ORDER — PROMETHAZINE HCL 25 MG PO TABS: 25.0000 mg | ORAL_TABLET | Freq: Four times a day (QID) | ORAL | Status: DC | PRN

## 2012-07-06 MED ORDER — CEPHALEXIN 500 MG PO CAPS: 500.0000 mg | ORAL_CAPSULE | Freq: Four times a day (QID) | ORAL | Status: DC

## 2012-07-06 NOTE — ED Provider Notes (Signed)
Medical screening examination/treatment/procedure(s) were performed by non-physician practitioner and as supervising physician I was immediately available for consultation/collaboration.   Lyanne Co, MD 07/06/12 [phone]

## 2012-07-06 NOTE — ED Provider Notes (Signed)
History     CSN: [id number]  Arrival date & time 07/05/12  2337   First MD Initiated Contact with Patient 07/05/12 2356      Chief Complaint  Patient presents with  . Back Pain  . Pelvic Pain    (Consider location/radiation/quality/duration/timing/severity/associated sxs/prior treatment) HPI Patient presents to the emergency department with back pain, and nausea for last 3 days. patient states she has chronic abdominal pain, that she's been evaluated on multiple occasions.patient states that she was seen at the [facility] earlier today for her symptoms.  Patient, states that she was not given any treatment.  Patient, states that she has had many CT scans done in the past, for her symptoms.  Patient denies chest pain, shortness of breath, fever, headache, weakness, dizziness, syncope, diarrhea, or numbness.  Patient, states she did not take anything prior to arrival, for her symptoms.  Patient has never seen a GI specialist for her chronic abdominal pain. Past Medical History  Diagnosis Date  . Eczema   . Bursitis   . Arthritis   . Dysmenorrhea   . Chronic constipation   . Iron deficiency anemia     Past Surgical History  Procedure Date  . Cesarean section     History reviewed. No pertinent family history.  History  Substance Use Topics  . Smoking status: Never Smoker   . Smokeless tobacco: Not on file  . Alcohol Use: No    OB History    Grav Para Term Preterm Abortions TAB SAB Ect Mult Living   2 1 1  0 1 0 1 0 0 1      Review of Systems All other systems negative except as documented in the HPI. All pertinent positives and negatives as reviewed in the HPI.  Allergies  Almond extract and Zofran  Home Medications   Current Outpatient Rx  Name  Route  Sig  Dispense  Refill  . BISMUTH SUBSALICYLATE 262 MG PO CHEW   Oral   Chew 524 mg by mouth daily as needed. For upset stomach         . RANITIDINE HCL 75 MG PO TABS   Oral   Take 75 mg by mouth daily as  needed. For acid reflux           BP 126/82  Pulse 120  Temp 98.4 F (36.9 C) (Oral)  Resp 18  Ht 5\' 3"  (1.6 m)  Wt 164 lb (74.39 kg)  BMI 29.05 kg/m2  SpO2 100%  LMP 06/16/2012  Physical Exam  Nursing note and vitals reviewed. Constitutional: She is oriented to person, place, and time. She appears well-developed and well-nourished. No distress.  HENT:  Head: Normocephalic and atraumatic.  Mouth/Throat: Oropharynx is clear and moist.  Cardiovascular: Normal rate, regular rhythm and normal heart sounds.  Exam reveals no gallop and no friction rub.   No murmur heard. Pulmonary/Chest: Effort normal and breath sounds normal. No respiratory distress.  Abdominal: Soft. Bowel sounds are normal. She exhibits no distension. There is generalized tenderness. There is no rebound and no guarding.    Neurological: She is alert and oriented to person, place, and time.    ED Course  Procedures (including critical care time)  Labs Reviewed  URINALYSIS, ROUTINE W REFLEX MICROSCOPIC - Abnormal; Notable for the following:    APPearance CLOUDY (*)     Leukocytes, UA SMALL (*)     All other components within normal limits  COMPREHENSIVE METABOLIC PANEL - Abnormal; Notable for the  following:    Total Bilirubin 0.2 (*)     All other components within normal limits  URINE MICROSCOPIC-ADD ON - Abnormal; Notable for the following:    Bacteria, UA FEW (*)     All other components within normal limits  POCT PREGNANCY, URINE  CBC WITH DIFFERENTIAL  LIPASE, BLOOD   Patient will be referred to GI and her doctor at the [facility].  Patient has a mild UTI.  We'll give anti-emetics and antibiotics.  Patient, is told to return here for any worsening in her condition.   MDM          Carlyle Dolly, PA-C 07/06/12 [phone]

## 2012-08-25 ENCOUNTER — Encounter (HOSPITAL_COMMUNITY): Payer: Self-pay | Admitting: Emergency Medicine

## 2012-08-25 ENCOUNTER — Emergency Department (HOSPITAL_COMMUNITY)
Admission: EM | Admit: 2012-08-25 | Discharge: 2012-08-25 | Disposition: A | Discharge status: Home / Self Care | Payer: Self-pay | Attending: Emergency Medicine | Admitting: Emergency Medicine

## 2012-08-25 DIAGNOSIS — Z8719 Personal history of other diseases of the digestive system: Secondary | ICD-10-CM | POA: Insufficient documentation

## 2012-08-25 DIAGNOSIS — Z862 Personal history of diseases of the blood and blood-forming organs and certain disorders involving the immune mechanism: Secondary | ICD-10-CM | POA: Insufficient documentation

## 2012-08-25 DIAGNOSIS — L299 Pruritus, unspecified: Secondary | ICD-10-CM | POA: Insufficient documentation

## 2012-08-25 DIAGNOSIS — Z8739 Personal history of other diseases of the musculoskeletal system and connective tissue: Secondary | ICD-10-CM | POA: Insufficient documentation

## 2012-08-25 DIAGNOSIS — R21 Rash and other nonspecific skin eruption: Secondary | ICD-10-CM | POA: Insufficient documentation

## 2012-08-25 DIAGNOSIS — Z8742 Personal history of other diseases of the female genital tract: Secondary | ICD-10-CM | POA: Insufficient documentation

## 2012-08-25 DIAGNOSIS — Z872 Personal history of diseases of the skin and subcutaneous tissue: Secondary | ICD-10-CM | POA: Insufficient documentation

## 2012-08-25 DIAGNOSIS — Z79899 Other long term (current) drug therapy: Secondary | ICD-10-CM | POA: Insufficient documentation

## 2012-08-25 DIAGNOSIS — B86 Scabies: Secondary | ICD-10-CM | POA: Insufficient documentation

## 2012-08-25 MED ORDER — PERMETHRIN 5 % EX CREA
TOPICAL_CREAM | Freq: Once | CUTANEOUS | Status: DC
  Filled 2012-08-25: qty 60

## 2012-08-25 MED ORDER — PERMETHRIN 5 % EX CREA: TOPICAL_CREAM | Freq: Once | CUTANEOUS | Status: DC

## 2012-08-25 NOTE — ED Provider Notes (Signed)
Medical screening examination/treatment/procedure(s) were performed by non-physician practitioner and as supervising physician I was immediately available for consultation/collaboration.  John-Adam Vallen Calabrese, M.D.     John-Adam Jolisa Intriago, MD 08/25/12 0350 

## 2012-08-25 NOTE — ED Provider Notes (Signed)
History     CSN: [id number]  Arrival date & time 08/25/12  0130   First MD Initiated Contact with Patient 08/25/12 0209      Chief Complaint  Patient presents with  . Scabies     (Consider location/radiation/quality/duration/timing/severity/associated sxs/prior treatment) HPI Comments: Patient stayed at a friends house for several days when she was told that her son has scabies   Now patient is having symptoms withj red itch rash between fingers on R hand, on her R elbow and in the antecubital area of her L elbow.   The history is provided by the patient.    Past Medical History  Diagnosis Date  . Eczema   . Bursitis   . Arthritis   . Dysmenorrhea   . Chronic constipation   . Iron deficiency anemia     Past Surgical History  Procedure Laterality Date  . Cesarean section      No family history on file.  History  Substance Use Topics  . Smoking status: Never Smoker   . Smokeless tobacco: Not on file  . Alcohol Use: No    OB History   Grav Para Term Preterm Abortions TAB SAB Ect Mult Living   2 1 1  0 1 0 1 0 0 1      Review of Systems  Constitutional: Negative.   HENT: Negative.   Eyes: Negative.   Respiratory: Negative.   Cardiovascular: Negative.   Gastrointestinal: Negative.   Genitourinary: Negative.   Musculoskeletal: Negative.   Skin: Positive for rash.  Neurological: Negative.     Allergies  Almond extract and Zofran  Home Medications   Current Outpatient Rx  Name  Route  Sig  Dispense  Refill  . dicyclomine (BENTYL) 10 MG capsule   Oral   Take 20 mg by mouth 3 (three) times daily as needed (for abdominal cramping).         . ranitidine (ZANTAC) 75 MG tablet   Oral   Take 75 mg by mouth daily as needed (for heartburn). For acid reflux         . permethrin (ELIMITE) 5 % cream   Topical   Apply topically once. Apply to entire body leaveon on for 8-12 hours than wash off  Repeat in 14 days   60 g   1     BP 120/78  Pulse 96   Temp(Src) 98.6 F (37 C) (Oral)  Resp 16  Ht 5\' 3"  (1.6 m)  Wt 165 lb (74.844 kg)  BMI 29.24 kg/m2  SpO2 100%  LMP 08/17/2012  Physical Exam  Constitutional: She appears well-developed and well-nourished. No distress.  HENT:  Head: Normocephalic.  Eyes: Pupils are equal, round, and reactive to light.  Cardiovascular: Normal rate.   Pulmonary/Chest: Effort normal.  Musculoskeletal: She exhibits no edema and no tenderness.  Neurological: She is alert.  Skin: Rash noted.  Red rash between fingers of R hand several red spots on R elbow and L [facility]     ED Course  Procedures (including critical care time)  Labs Reviewed - No data to display No results found.   1. Scabies       MDM   Most likely scabies will treat  Patinet will contact her pediatrician on Monday        Arman Filter, NP 08/25/12 [id number]

## 2012-08-25 NOTE — ED Notes (Signed)
Pt c/o itching to webbing between fingers and AC's pt states she recently stayed at friends house where child was infected.

## 2012-08-29 ENCOUNTER — Ambulatory Visit: Payer: Self-pay | Admitting: Internal Medicine

## 2012-09-02 ENCOUNTER — Encounter: Payer: Self-pay | Admitting: Internal Medicine

## 2012-09-03 ENCOUNTER — Ambulatory Visit: Payer: Self-pay | Admitting: Internal Medicine

## 2012-12-03 ENCOUNTER — Emergency Department (HOSPITAL_COMMUNITY)
Admission: EM | Admit: 2012-12-03 | Discharge: 2012-12-03 | Disposition: A | Discharge status: Home / Self Care | Payer: Self-pay | Attending: Emergency Medicine | Admitting: Emergency Medicine

## 2012-12-03 ENCOUNTER — Encounter (HOSPITAL_COMMUNITY): Payer: Self-pay | Admitting: *Deleted

## 2012-12-03 ENCOUNTER — Emergency Department (HOSPITAL_COMMUNITY): Payer: Self-pay

## 2012-12-03 DIAGNOSIS — M7150 Other bursitis, not elsewhere classified, unspecified site: Secondary | ICD-10-CM | POA: Insufficient documentation

## 2012-12-03 DIAGNOSIS — K5900 Constipation, unspecified: Secondary | ICD-10-CM | POA: Insufficient documentation

## 2012-12-03 DIAGNOSIS — Z3202 Encounter for pregnancy test, result negative: Secondary | ICD-10-CM | POA: Insufficient documentation

## 2012-12-03 DIAGNOSIS — Z8742 Personal history of other diseases of the female genital tract: Secondary | ICD-10-CM | POA: Insufficient documentation

## 2012-12-03 DIAGNOSIS — R109 Unspecified abdominal pain: Secondary | ICD-10-CM | POA: Insufficient documentation

## 2012-12-03 DIAGNOSIS — M129 Arthropathy, unspecified: Secondary | ICD-10-CM | POA: Insufficient documentation

## 2012-12-03 DIAGNOSIS — Z872 Personal history of diseases of the skin and subcutaneous tissue: Secondary | ICD-10-CM | POA: Insufficient documentation

## 2012-12-03 DIAGNOSIS — R102 Pelvic and perineal pain: Secondary | ICD-10-CM

## 2012-12-03 DIAGNOSIS — Z862 Personal history of diseases of the blood and blood-forming organs and certain disorders involving the immune mechanism: Secondary | ICD-10-CM | POA: Insufficient documentation

## 2012-12-03 LAB — URINALYSIS, ROUTINE W REFLEX MICROSCOPIC
Glucose, UA: NEGATIVE mg/dL
Hgb urine dipstick: NEGATIVE
Leukocytes, UA: NEGATIVE
Specific Gravity, Urine: 1.024 (ref 1.005–1.030)
Urobilinogen, UA: 0.2 mg/dL (ref 0.0–1.0)

## 2012-12-03 LAB — POCT PREGNANCY, URINE: Preg Test, Ur: NEGATIVE

## 2012-12-03 LAB — WET PREP, GENITAL: Yeast Wet Prep HPF POC: NONE SEEN

## 2012-12-03 MED ORDER — FENTANYL CITRATE 0.05 MG/ML IJ SOLN
100.0000 ug | Freq: Once | INTRAMUSCULAR | Status: AC
  Administered 2012-12-03: 100 ug via INTRAVENOUS
  Filled 2012-12-03: qty 2

## 2012-12-03 MED ORDER — HYDROCODONE-ACETAMINOPHEN 5-325 MG PO TABS: 1.0000 | ORAL_TABLET | Freq: Four times a day (QID) | ORAL | Status: DC | PRN

## 2012-12-03 MED ORDER — SODIUM CHLORIDE 0.9 % IV SOLN
INTRAVENOUS | Status: DC
  Administered 2012-12-03: 06:00:00 via INTRAVENOUS

## 2012-12-03 MED ORDER — FENTANYL CITRATE 0.05 MG/ML IJ SOLN: 100.0000 ug | INTRAMUSCULAR | Status: DC | PRN

## 2012-12-03 NOTE — ED Notes (Signed)
Pt c/o lower abd/pelvic pain since yesterday; no urinary symptoms; no nausea or vomiting

## 2012-12-03 NOTE — ED Provider Notes (Signed)
History     CSN: [id number]  Arrival date & time 12/03/12  0415   First MD Initiated Contact with Patient 12/03/12 0501      Chief Complaint  Patient presents with  . Abdominal Pain    (Consider location/radiation/quality/duration/timing/severity/associated sxs/prior treatment) HPI This is a 22 year old female with a long-standing history of episodic left lower abdominal pain. She has had this evaluated in the past without a definitive diagnosis. She is here with left suprapubic pain that began about 12 hours ago. It has waxed and waned but generally has worsened. There is radiation of the pain to the right lower quadrant; the left lower quadrant pain is described as sharp, that on the right is dull. The pain is moderate to severe. It is worse with movement or palpation. She denies nausea, vomiting, diarrhea, urinary symptoms, vaginal bleeding or discharge. She took a muscle relaxant without relief.  Past Medical History  Diagnosis Date  . Eczema   . Bursitis   . Arthritis   . Dysmenorrhea   . Chronic constipation   . Iron deficiency anemia     Past Surgical History  Procedure Laterality Date  . Cesarean section      No family history on file.  History  Substance Use Topics  . Smoking status: Never Smoker   . Smokeless tobacco: Not on file  . Alcohol Use: No    OB History   Grav Para Term Preterm Abortions TAB SAB Ect Mult Living   2 1 1  0 1 0 1 0 0 1      Review of Systems  All other systems reviewed and are negative.    Allergies  Almond extract and Zofran  Home Medications   Current Outpatient Rx  Name  Route  Sig  Dispense  Refill  . cyclobenzaprine (FLEXERIL) 10 MG tablet   Oral   Take 10 mg by mouth 3 (three) times daily as needed for muscle spasms.           BP 118/67  Pulse 90  Temp(Src) 98.8 F (37.1 C) (Oral)  Resp 20  SpO2 100%  LMP 11/19/2012  Physical Exam General: Well-developed, well-nourished female who appears  uncomfortable; appearance consistent with age of record HENT: normocephalic, atraumatic Eyes: pupils equal round and reactive to light; extraocular muscles intact Neck: supple Heart: regular rate and rhythm Lungs: clear to auscultation bilaterally Abdomen: soft; nondistended; lower abdominal tenderness; no masses or hepatosplenomegaly; bowel sounds present GU: Normal external genitalia; physiologic appearing vaginal discharge; no vaginal bleeding; cervix normal appearance; no cervical motion tenderness; left adnexal tenderness Extremities: No deformity; full range of motion; pulses normal; no edema Neurologic: Awake, alert and oriented; motor function intact in all extremities and symmetric; no facial droop Skin: Warm and dry Psychiatric: Anxious    ED Course  Procedures (including critical care time)     MDM   Nursing notes and vitals signs, including pulse oximetry, reviewed.  Summary of this visit's results, reviewed by myself:  Labs:  Results for orders placed during the hospital encounter of 12/03/12 (from the past 24 hour(s))  URINALYSIS, ROUTINE W REFLEX MICROSCOPIC     Status: None   Collection Time    12/03/12  4:43 AM      Result Value Range   Color, Urine YELLOW  YELLOW   APPearance CLEAR  CLEAR   Specific Gravity, Urine 1.024  1.005 - 1.030   pH 6.0  5.0 - 8.0   Glucose, UA NEGATIVE  NEGATIVE mg/dL  Hgb urine dipstick NEGATIVE  NEGATIVE   Bilirubin Urine NEGATIVE  NEGATIVE   Ketones, ur NEGATIVE  NEGATIVE mg/dL   Protein, ur NEGATIVE  NEGATIVE mg/dL   Urobilinogen, UA 0.2  0.0 - 1.0 mg/dL   Nitrite NEGATIVE  NEGATIVE   Leukocytes, UA NEGATIVE  NEGATIVE  POCT PREGNANCY, URINE     Status: None   Collection Time    2012-12-11  4:46 AM      Result Value Range   Preg Test, Ur NEGATIVE  NEGATIVE  WET PREP, GENITAL     Status: Abnormal   Collection Time    2012/12/11  5:41 AM      Result Value Range   Yeast Wet Prep HPF POC NONE SEEN  NONE SEEN   Trich, Wet  Prep NONE SEEN  NONE SEEN   Clue Cells Wet Prep HPF POC NONE SEEN  NONE SEEN   WBC, Wet Prep HPF POC RARE (*) NONE SEEN    Imaging Studies: US Transvaginal Non-ob  2012/12/11   *RADIOLOGY REPORT*  Clinical Data: Pelvic pain.  TRANSABDOMINAL AND TRANSVAGINAL ULTRASOUND OF PELVIS Technique:  Both transabdominal and transvaginal ultrasound examinations of the pelvis were performed. Transabdominal technique was performed for global imaging of the pelvis including uterus, ovaries, adnexal regions, and pelvic cul-de-sac.  It was necessary to proceed with endovaginal exam following the transabdominal exam to visualize the ovaries and endometrium.  Comparison:  CT scan 07/29/2011.  Findings:  Uterus: Measures 9.7 x 4.3 x 5.3 cm.No myometrial abnormalities are demonstrated.  Endometrium: Normal in thickness measuring a maximum of 10 mm.  A small amount of fluid is noted in the lower endometrial canal.  Right ovary:  Measures 4.3 x 1.8 x 2.9 cm.  No cysts or masses.  Left ovary: Measures 4.6 x 2.2 x 3.2 cm.  No cysts or masses.  Other findings: Small amount of free pelvic fluid.  IMPRESSION:  1.  Normal sonographic appearance of the uterus. 2.  Small amount of endometrial fluid. 3.  Normal ovaries. 4.  Small amount of free pelvic fluid.   Original Report Authenticated By: Rudie Meyer, M.D.   US Pelvis Complete  12-11-12   *RADIOLOGY REPORT*  Clinical Data: Pelvic pain.  TRANSABDOMINAL AND TRANSVAGINAL ULTRASOUND OF PELVIS Technique:  Both transabdominal and transvaginal ultrasound examinations of the pelvis were performed. Transabdominal technique was performed for global imaging of the pelvis including uterus, ovaries, adnexal regions, and pelvic cul-de-sac.  It was necessary to proceed with endovaginal exam following the transabdominal exam to visualize the ovaries and endometrium.  Comparison:  CT scan 07/29/2011.  Findings:  Uterus: Measures 9.7 x 4.3 x 5.3 cm.No myometrial abnormalities are demonstrated.   Endometrium: Normal in thickness measuring a maximum of 10 mm.  A small amount of fluid is noted in the lower endometrial canal.  Right ovary:  Measures 4.3 x 1.8 x 2.9 cm.  No cysts or masses.  Left ovary: Measures 4.6 x 2.2 x 3.2 cm.  No cysts or masses.  Other findings: Small amount of free pelvic fluid.  IMPRESSION:  1.  Normal sonographic appearance of the uterus. 2.  Small amount of endometrial fluid. 3.  Normal ovaries. 4.  Small amount of free pelvic fluid.   Original Report Authenticated By: Rudie Meyer, M.D.    Patient states she will [facility].         Hanley Seamen, MD 12/11/12 [phone]

## 2012-12-03 NOTE — ED Notes (Signed)
Pt presents paperwork from [facility] stating had "testing for autoimmune processess that could involve the pancreas; IgG4 was elevated"

## 2012-12-15 ENCOUNTER — Encounter (HOSPITAL_COMMUNITY): Payer: Self-pay | Admitting: *Deleted

## 2012-12-15 ENCOUNTER — Emergency Department (HOSPITAL_COMMUNITY)
Admission: EM | Admit: 2012-12-15 | Discharge: 2012-12-15 | Disposition: A | Discharge status: Home / Self Care | Payer: Self-pay | Attending: Emergency Medicine | Admitting: Emergency Medicine

## 2012-12-15 DIAGNOSIS — Z8742 Personal history of other diseases of the female genital tract: Secondary | ICD-10-CM | POA: Insufficient documentation

## 2012-12-15 DIAGNOSIS — Z862 Personal history of diseases of the blood and blood-forming organs and certain disorders involving the immune mechanism: Secondary | ICD-10-CM | POA: Insufficient documentation

## 2012-12-15 DIAGNOSIS — Z872 Personal history of diseases of the skin and subcutaneous tissue: Secondary | ICD-10-CM | POA: Insufficient documentation

## 2012-12-15 DIAGNOSIS — M129 Arthropathy, unspecified: Secondary | ICD-10-CM | POA: Insufficient documentation

## 2012-12-15 DIAGNOSIS — Z8739 Personal history of other diseases of the musculoskeletal system and connective tissue: Secondary | ICD-10-CM | POA: Insufficient documentation

## 2012-12-15 DIAGNOSIS — K5900 Constipation, unspecified: Secondary | ICD-10-CM | POA: Insufficient documentation

## 2012-12-15 DIAGNOSIS — B86 Scabies: Secondary | ICD-10-CM | POA: Insufficient documentation

## 2012-12-15 HISTORY — DX: Scabies: B86

## 2012-12-15 MED ORDER — PERMETHRIN 5 % EX CREA: TOPICAL_CREAM | CUTANEOUS | Status: DC

## 2012-12-15 NOTE — ED Provider Notes (Signed)
Medical screening examination/treatment/procedure(s) were performed by non-physician practitioner and as supervising physician I was immediately available for consultation/collaboration.  Stanley Lyness M Calais Svehla, MD 12/15/12 0629 

## 2012-12-15 NOTE — ED Provider Notes (Signed)
History     CSN: [id number]  Arrival date & time 12/15/12  0350   First MD Initiated Contact with Patient 12/15/12 [phone]      Chief Complaint  Patient presents with  . Insect Bite    (Consider location/radiation/quality/duration/timing/severity/associated sxs/prior treatment) HPI  Patient to the ED requesting cream for her scabies. She says she has had it before and got it from the same person. She started noticing the bugs and bites yesterday. nad vss  Past Medical History  Diagnosis Date  . Eczema   . Bursitis   . Arthritis   . Dysmenorrhea   . Chronic constipation   . Iron deficiency anemia   . Scabies infestation     Past Surgical History  Procedure Laterality Date  . Cesarean section      History reviewed. No pertinent family history.  History  Substance Use Topics  . Smoking status: Never Smoker   . Smokeless tobacco: Not on file  . Alcohol Use: No    OB History   Grav Para Term Preterm Abortions TAB SAB Ect Mult Living   2 1 1  0 1 0 1 0 0 1      Review of Systems  Skin: Positive for rash.  All other systems reviewed and are negative.    Allergies  Almond extract and Zofran  Home Medications   Current Outpatient Rx  Name  Route  Sig  Dispense  Refill  . permethrin (ELIMITE) 5 % cream      Apply to affected area once   60 g   1     BP 126/72  Pulse 116  Temp(Src) 98.9 F (37.2 C) (Oral)  Resp 20  Ht 5\' 3"  (1.6 m)  Wt 158 lb (71.668 kg)  BMI 28 kg/m2  SpO2 100%  LMP 11/19/2012  Physical Exam  Nursing note and vitals reviewed. Constitutional: She appears well-developed and well-nourished. No distress.  HENT:  Head: Normocephalic and atraumatic.  Eyes: Pupils are equal, round, and reactive to light.  Neck: Normal range of motion. Neck supple.  Cardiovascular: Normal rate and regular rhythm.   Pulmonary/Chest: Effort normal.  Abdominal: Soft.  Neurological: She is alert.  Skin: Skin is warm and dry. Rash noted.    ED  Course  Procedures (including critical care time)  Labs Reviewed - No data to display No results found.   1. Scabies       MDM  permethrin (ELIMITE) 5 % cream Apply to affected area once 60 g Dorthula Matas, PA-C   Pt has been advised of the symptoms that warrant their return to the ED. Patient has voiced understanding and has agreed to follow-up with the PCP or specialist.         Dorthula Matas, PA-C 12/15/12 [phone]

## 2012-12-15 NOTE — ED Notes (Signed)
Pt presents w/ c/o itching and rash with known exposure to scabies and prior hx of same.

## 2013-02-16 ENCOUNTER — Encounter (HOSPITAL_COMMUNITY): Payer: Self-pay

## 2013-02-16 ENCOUNTER — Emergency Department (INDEPENDENT_AMBULATORY_CARE_PROVIDER_SITE_OTHER)
Admission: EM | Admit: 2013-02-16 | Discharge: 2013-02-16 | Disposition: A | Discharge status: Home / Self Care | Payer: Self-pay | Source: Home / Self Care

## 2013-02-16 DIAGNOSIS — K089 Disorder of teeth and supporting structures, unspecified: Secondary | ICD-10-CM

## 2013-02-16 DIAGNOSIS — K0889 Other specified disorders of teeth and supporting structures: Secondary | ICD-10-CM

## 2013-02-16 MED ORDER — HYDROCODONE-ACETAMINOPHEN 5-325 MG PO TABS: 1.0000 | ORAL_TABLET | Freq: Four times a day (QID) | ORAL | Status: DC | PRN

## 2013-02-16 MED ORDER — AMOXICILLIN 500 MG PO CAPS: 500.0000 mg | ORAL_CAPSULE | Freq: Three times a day (TID) | ORAL | Status: DC

## 2013-02-16 NOTE — ED Provider Notes (Signed)
Becky Young is a 22 y.o. female who presents to Urgent Care today for tooth pain. 4 days ago patient developed pain in her left lower wisdom tooth. She thinks her wisdom tooth is erupting and is quite painful. She notes jaw swelling and pain especially worse with eating. She has tried multiple over-the-counter made pain medications except not been very effective. She denies any radiating pain fevers or chills and feels well otherwise. No nausea vomiting or diarrhea   PMH reviewed. Healthy otherwise History  Substance Use Topics  . Smoking status: Never Smoker   . Smokeless tobacco: Not on file  . Alcohol Use: No   ROS as above Medications reviewed. No current facility-administered medications for this encounter.   Current Outpatient Prescriptions  Medication Sig Dispense Refill  . amoxicillin (AMOXIL) 500 MG capsule Take 1 capsule (500 mg total) by mouth 3 (three) times daily.  60 capsule  0  . HYDROcodone-acetaminophen (NORCO) 5-325 MG per tablet Take 1 tablet by mouth every 6 (six) hours as needed for pain.  30 tablet  0  . [DISCONTINUED] desogestrel-ethinyl estradiol (APRI,EMOQUETTE,SOLIA) 0.15-30 MG-MCG tablet Take 1 tablet by mouth daily.          Exam:  BP 121/76  Pulse 98  Temp(Src) 98.4 F (36.9 C) (Oral)  Resp 18  SpO2 100% Gen: Well NAD HEENT: EOMI,  MMM. Left lower wisdom tooth erupting with no tooth exposed yet. Tender to touch. Surrounding erythema. Jaw mildly tender on the left side.     No results found for this or any previous visit (from the past 24 hour(s)). No results found.  Assessment and Plan: 22 y.o. female with tooth pain likely due to erupting wisdom tooth. Possibly infected. Plan to treat with Norco and amoxicillin. Followup with dentist as soon as possible. Discussed warning signs or symptoms. Please see discharge instructions. Patient expresses understanding.      Rodolph Bong, MD 02/16/13 [phone]

## 2013-05-29 ENCOUNTER — Encounter (HOSPITAL_COMMUNITY): Payer: Self-pay | Admitting: Emergency Medicine

## 2013-05-29 ENCOUNTER — Emergency Department (HOSPITAL_COMMUNITY)
Admission: EM | Admit: 2013-05-29 | Discharge: 2013-05-30 | Disposition: A | Discharge status: Home / Self Care | Payer: Self-pay | Attending: Emergency Medicine | Admitting: Emergency Medicine

## 2013-05-29 DIAGNOSIS — R1031 Right lower quadrant pain: Secondary | ICD-10-CM | POA: Insufficient documentation

## 2013-05-29 DIAGNOSIS — Z872 Personal history of diseases of the skin and subcutaneous tissue: Secondary | ICD-10-CM | POA: Insufficient documentation

## 2013-05-29 DIAGNOSIS — Z3202 Encounter for pregnancy test, result negative: Secondary | ICD-10-CM | POA: Insufficient documentation

## 2013-05-29 DIAGNOSIS — M549 Dorsalgia, unspecified: Secondary | ICD-10-CM | POA: Insufficient documentation

## 2013-05-29 DIAGNOSIS — Z8719 Personal history of other diseases of the digestive system: Secondary | ICD-10-CM | POA: Insufficient documentation

## 2013-05-29 DIAGNOSIS — R109 Unspecified abdominal pain: Secondary | ICD-10-CM

## 2013-05-29 DIAGNOSIS — R1032 Left lower quadrant pain: Secondary | ICD-10-CM | POA: Insufficient documentation

## 2013-05-29 DIAGNOSIS — M129 Arthropathy, unspecified: Secondary | ICD-10-CM | POA: Insufficient documentation

## 2013-05-29 DIAGNOSIS — N949 Unspecified condition associated with female genital organs and menstrual cycle: Secondary | ICD-10-CM | POA: Insufficient documentation

## 2013-05-29 DIAGNOSIS — Z862 Personal history of diseases of the blood and blood-forming organs and certain disorders involving the immune mechanism: Secondary | ICD-10-CM | POA: Insufficient documentation

## 2013-05-29 DIAGNOSIS — Z8742 Personal history of other diseases of the female genital tract: Secondary | ICD-10-CM | POA: Insufficient documentation

## 2013-05-29 DIAGNOSIS — Z8619 Personal history of other infectious and parasitic diseases: Secondary | ICD-10-CM | POA: Insufficient documentation

## 2013-05-29 LAB — CBC WITH DIFFERENTIAL/PLATELET
Basophils Absolute: 0 10*3/uL (ref 0.0–0.1)
Basophils Relative: 0 % (ref 0–1)
Eosinophils Absolute: 0.2 10*3/uL (ref 0.0–0.7)
HCT: 34.9 % — ABNORMAL LOW (ref 36.0–46.0)
Hemoglobin: 11.8 g/dL — ABNORMAL LOW (ref 12.0–15.0)
Lymphocytes Relative: 45 % (ref 12–46)
Lymphs Abs: 3.9 10*3/uL (ref 0.7–4.0)
MCHC: 33.8 g/dL (ref 30.0–36.0)
MCV: 85.7 fL (ref 78.0–100.0)
Monocytes Absolute: 0.5 10*3/uL (ref 0.1–1.0)
Neutro Abs: 4 10*3/uL (ref 1.7–7.7)
Neutrophils Relative %: 47 % (ref 43–77)
RDW: 12.3 % (ref 11.5–15.5)
WBC: 8.6 10*3/uL (ref 4.0–10.5)

## 2013-05-29 LAB — POCT I-STAT, CHEM 8
BUN: 14 mg/dL (ref 6–23)
Calcium, Ion: 0.59 mmol/L — CL (ref 1.12–1.23)
Chloride: 116 mEq/L — ABNORMAL HIGH (ref 96–112)
Creatinine, Ser: 0.9 mg/dL (ref 0.50–1.10)
Glucose, Bld: 72 mg/dL (ref 70–99)
Potassium: 4.5 mEq/L (ref 3.5–5.1)

## 2013-05-29 LAB — WET PREP, GENITAL
Clue Cells Wet Prep HPF POC: NONE SEEN
WBC, Wet Prep HPF POC: NONE SEEN

## 2013-05-29 LAB — POCT PREGNANCY, URINE: Preg Test, Ur: NEGATIVE

## 2013-05-29 MED ORDER — OXYCODONE-ACETAMINOPHEN 5-325 MG PO TABS
1.0000 | ORAL_TABLET | Freq: Once | ORAL | Status: AC
  Administered 2013-05-29: 1 via ORAL
  Filled 2013-05-29: qty 1

## 2013-05-29 NOTE — ED Notes (Addendum)
Notified EDP, Walden,MD. Pt. i-stat Chem 8 results iCa 0.59 and Sodium 132.

## 2013-05-29 NOTE — ED Provider Notes (Signed)
CSN: [id number]     Arrival date & time 05/29/13  2126 History   First MD Initiated Contact with Patient 05/29/13 2139     Chief Complaint  Patient presents with  . Abdominal Pain  . Back Pain   (Consider location/radiation/quality/duration/timing/severity/associated sxs/prior Treatment) HPI Becky Young is a 22 y.o. female who presents to emergency department complaining of lower abdominal pain and back pain. Patient states her symptoms began 3 days ago. States pain is mainly in the lower abdomen. States feels "crampy and sharp." She states she currently is undergoing evaluation by gastroenterologist for "diffuse abdominal pain and food allergies, states had an endoscopy colonoscopy was told that she may have food allergies but was not given a diagnosis. Patient states this does not feel like the pain that she has had in the past. She denies any urinary or vaginal symptoms. She denies any nausea or vomiting. She states she had a normal bowel movement this morning. She denies any fever. She did not take any medications for this. She denies being pregnant, last period 2 weeks ago.  Past Medical History  Diagnosis Date  . Eczema   . Bursitis   . Arthritis   . Dysmenorrhea   . Chronic constipation   . Iron deficiency anemia   . Scabies infestation    Past Surgical History  Procedure Laterality Date  . Cesarean section     History reviewed. No pertinent family history. History  Substance Use Topics  . Smoking status: Never Smoker   . Smokeless tobacco: Not on file  . Alcohol Use: No   OB History   Grav Para Term Preterm Abortions TAB SAB Ect Mult Living   2 1 1  0 1 0 1 0 0 1     Review of Systems  Constitutional: Negative for fever and chills.  Respiratory: Negative for cough, chest tightness and shortness of breath.   Cardiovascular: Negative for chest pain, palpitations and leg swelling.  Gastrointestinal: Positive for abdominal pain. Negative for nausea, vomiting and  diarrhea.  Genitourinary: Positive for pelvic pain. Negative for dysuria, frequency, hematuria, flank pain, vaginal bleeding, vaginal discharge, vaginal pain and menstrual problem.  Musculoskeletal: Negative for arthralgias, myalgias, neck pain and neck stiffness.  Skin: Negative for rash.  Neurological: Negative for dizziness, weakness and headaches.  All other systems reviewed and are negative.    Allergies  Almond extract and Zofran  Home Medications   Current Outpatient Rx  Name  Route  Sig  Dispense  Refill  . naproxen (NAPROSYN) 250 MG tablet   Oral   Take 250 mg by mouth 2 (two) times daily as needed (pain).          BP 110/61  Pulse 83  Temp(Src) 98.4 F (36.9 C) (Oral)  Resp 18  Ht 5\' 3"  (1.6 m)  Wt 156 lb (70.761 kg)  BMI 27.64 kg/m2  SpO2 100%  LMP 05/15/2013 Physical Exam  Nursing note and vitals reviewed. Constitutional: She is oriented to person, place, and time. She appears well-developed and well-nourished. No distress.  HENT:  Head: Normocephalic.  Eyes: Conjunctivae are normal.  Neck: Neck supple.  Cardiovascular: Normal rate, regular rhythm and normal heart sounds.   Pulmonary/Chest: Effort normal and breath sounds normal. No respiratory distress. She has no wheezes. She has no rales.  Abdominal: Soft. Bowel sounds are normal. She exhibits no distension. There is tenderness. There is no rebound and no guarding.  Right lower quadrant and left lower quadrant tenderness. No CVA  tenderness bilaterally  Genitourinary:  Normal external genitalia. White thin vaginal discharge. No cervical motion tenderness. Uterine, right and left adnexal tenderness.  Musculoskeletal: She exhibits no edema.  Neurological: She is alert and oriented to person, place, and time.  Skin: Skin is warm and dry.  Psychiatric: She has a normal mood and affect. Her behavior is normal.    ED Course  Procedures (including critical care time) Labs Review Labs Reviewed  CBC WITH  DIFFERENTIAL - Abnormal; Notable for the following:    Hemoglobin 11.8 (*)    HCT 34.9 (*)    All other components within normal limits  POCT I-STAT, CHEM 8 - Abnormal; Notable for the following:    Sodium 132 (*)    Chloride 116 (*)    Calcium, Ion 0.59 (*)    All other components within normal limits  GC/CHLAMYDIA PROBE AMP  WET PREP, GENITAL  URINALYSIS W MICROSCOPIC + REFLEX CULTURE  CALCIUM  POCT PREGNANCY, URINE   Imaging Review No results found.  EKG Interpretation   None       MDM   1. Abdominal pain     PT with lower abdominal pain. Exam shows pain and tenderness in RLQ and LLQ. Pain significant. No peritoneal signs. Pelvic exam unremarkable. Concerning for possible appendicitis given exam findings. Will get CT abd/pelvis.   Pt signed out to Becky Young at shift change.     Becky Mussel, PA-C 06/02/13 2335

## 2013-05-29 NOTE — ED Notes (Signed)
Pt complains of lower abdominal pain and low back pain

## 2013-05-29 NOTE — ED Notes (Signed)
Pt is aware of the need for urine sample, however is unable to provide one at this time.  

## 2013-05-30 ENCOUNTER — Emergency Department (HOSPITAL_COMMUNITY): Payer: Self-pay

## 2013-05-30 ENCOUNTER — Encounter (HOSPITAL_COMMUNITY): Payer: Self-pay

## 2013-05-30 LAB — URINALYSIS W MICROSCOPIC + REFLEX CULTURE
Bilirubin Urine: NEGATIVE
Hgb urine dipstick: NEGATIVE
Leukocytes, UA: NEGATIVE
Nitrite: NEGATIVE
Protein, ur: NEGATIVE mg/dL
Specific Gravity, Urine: 1.017 (ref 1.005–1.030)
Urobilinogen, UA: 0.2 mg/dL (ref 0.0–1.0)
pH: 7.5 (ref 5.0–8.0)

## 2013-05-30 LAB — GC/CHLAMYDIA PROBE AMP
CT Probe RNA: NEGATIVE
GC Probe RNA: NEGATIVE

## 2013-05-30 MED ORDER — IOHEXOL 300 MG/ML  SOLN
100.0000 mL | Freq: Once | INTRAMUSCULAR | Status: AC | PRN
  Administered 2013-05-30: 100 mL via INTRAVENOUS

## 2013-05-30 MED ORDER — HYDROCODONE-ACETAMINOPHEN 5-325 MG PO TABS: 1.0000 | ORAL_TABLET | Freq: Four times a day (QID) | ORAL | Status: DC | PRN

## 2013-05-30 MED ORDER — IOHEXOL 300 MG/ML  SOLN
50.0000 mL | Freq: Once | INTRAMUSCULAR | Status: AC | PRN
  Administered 2013-05-30: 50 mL via ORAL

## 2013-05-30 NOTE — ED Provider Notes (Signed)
2:19 AM patient is feeling much better, drinking water and has walked to the bathroom without any return of abdominal pain. CT results shared with patient. She has called her OB/GYN and agrees to close followup. She has also recently had endoscopy and is waiting on biopsy results.  Plan discharge home followup OB/GYN and GI as scheduled. Prescription provided. Return precautions verbalized as understood.  Results for orders placed during the hospital encounter of 05/29/13  WET PREP, GENITAL      Result Value Range   Yeast Wet Prep HPF POC NONE SEEN  NONE SEEN   Trich, Wet Prep NONE SEEN  NONE SEEN   Clue Cells Wet Prep HPF POC NONE SEEN  NONE SEEN   WBC, Wet Prep HPF POC NONE SEEN  NONE SEEN  CBC WITH DIFFERENTIAL      Result Value Range   WBC 8.6  4.0 - 10.5 K/uL   RBC 4.07  3.87 - 5.11 MIL/uL   Hemoglobin 11.8 (*) 12.0 - 15.0 g/dL   HCT 16.1 (*) 09.6 - 04.5 %   MCV 85.7  78.0 - 100.0 fL   MCH 29.0  26.0 - 34.0 pg   MCHC 33.8  30.0 - 36.0 g/dL   RDW 40.9  81.1 - 91.4 %   Platelets 307  150 - 400 K/uL   Neutrophils Relative % 47  43 - 77 %   Neutro Abs 4.0  1.7 - 7.7 K/uL   Lymphocytes Relative 45  12 - 46 %   Lymphs Abs 3.9  0.7 - 4.0 K/uL   Monocytes Relative 6  3 - 12 %   Monocytes Absolute 0.5  0.1 - 1.0 K/uL   Eosinophils Relative 2  0 - 5 %   Eosinophils Absolute 0.2  0.0 - 0.7 K/uL   Basophils Relative 0  0 - 1 %   Basophils Absolute 0.0  0.0 - 0.1 K/uL  URINALYSIS W MICROSCOPIC + REFLEX CULTURE      Result Value Range   Color, Urine YELLOW  YELLOW   APPearance CLEAR  CLEAR   Specific Gravity, Urine 1.017  1.005 - 1.030   pH 7.5  5.0 - 8.0   Glucose, UA NEGATIVE  NEGATIVE mg/dL   Hgb urine dipstick NEGATIVE  NEGATIVE   Bilirubin Urine NEGATIVE  NEGATIVE   Ketones, ur NEGATIVE  NEGATIVE mg/dL   Protein, ur NEGATIVE  NEGATIVE mg/dL   Urobilinogen, UA 0.2  0.0 - 1.0 mg/dL   Nitrite NEGATIVE  NEGATIVE   Leukocytes, UA NEGATIVE  NEGATIVE   Squamous Epithelial / LPF  RARE  RARE  CALCIUM      Result Value Range   Calcium 9.4  8.4 - 10.5 mg/dL  POCT I-STAT, CHEM 8      Result Value Range   Sodium 132 (*) 135 - 145 mEq/L   Potassium 4.5  3.5 - 5.1 mEq/L   Chloride 116 (*) 96 - 112 mEq/L   BUN 14  6 - 23 mg/dL   Creatinine, Ser 7.82  0.50 - 1.10 mg/dL   Glucose, Bld 72  70 - 99 mg/dL   Calcium, Ion 9.56 (*) 1.12 - 1.23 mmol/L   TCO2 12  0 - 100 mmol/L   Hemoglobin 12.6  12.0 - 15.0 g/dL   HCT 21.3  08.6 - 57.8 %   Comment NOTIFIED PHYSICIAN    POCT PREGNANCY, URINE      Result Value Range   Preg Test, Ur NEGATIVE  NEGATIVE  Ct Abdomen Pelvis W Contrast  05/30/2013   CLINICAL DATA:  Lower abdominal pain and back pain.  EXAM: CT ABDOMEN AND PELVIS WITH CONTRAST  TECHNIQUE: Multidetector CT imaging of the abdomen and pelvis was performed using the standard protocol following bolus administration of intravenous contrast.  CONTRAST:  OMNIPAQUE IOHEXOL 300 MG/ML  SOLN  COMPARISON:  CT of the abdomen and pelvis performed 07/29/2011, and pelvic ultrasound performed 12/03/2012  FINDINGS: The visualized lung bases are clear.  The previously noted tiny hepatic hypodensity is less well characterized on the current study and likely benign, though a somewhat larger 4 mm hypodensity is noted at the inferior tip of the liver, better characterized than on the prior study. The liver and spleen are otherwise unremarkable in appearance. The gallbladder is within normal limits. The pancreas and adrenal glands are unremarkable.  The kidneys are unremarkable in appearance. There is no evidence of hydronephrosis. No renal or ureteral stones are seen. No perinephric stranding is appreciated.  The small bowel is unremarkable in appearance. The stomach is within normal limits. No acute vascular abnormalities are seen.  The appendix is normal in caliber and contains air, without evidence for appendicitis. The colon is unremarkable in appearance.  The bladder is mildly distended  and grossly unremarkable. The uterus is within normal limits. The left ovary is slightly larger than the right, but no suspicious adnexal masses are seen. Trace free fluid within the pelvis is likely physiologic in nature. No inguinal lymphadenopathy is seen.  No acute osseous abnormalities are identified.  IMPRESSION: 1. No acute abnormality seen within the abdomen or pelvis. 2. Likely tiny hepatic cysts.   Electronically Signed   By: Roanna Raider M.D.   On: 05/30/2013 01:25      Sunnie Nielsen, MD 05/30/13 [phone]

## 2013-05-30 NOTE — ED Notes (Signed)
Patient transported to CT 

## 2013-05-30 NOTE — ED Notes (Signed)
Pt able to maintain fluids and ambulates well. Pt states she is ready to go home.

## 2013-06-03 NOTE — ED Provider Notes (Signed)
Medical screening examination/treatment/procedure(s) were conducted as a shared visit with non-physician practitioner(s) and myself.  I personally evaluated the patient during the encounter.    Sunnie Nielsen, MD 06/03/13 [phone]

## 2014-01-26 ENCOUNTER — Encounter (HOSPITAL_COMMUNITY): Payer: Self-pay | Admitting: Emergency Medicine

## 2014-01-26 ENCOUNTER — Emergency Department (INDEPENDENT_AMBULATORY_CARE_PROVIDER_SITE_OTHER)
Admission: EM | Admit: 2014-01-26 | Discharge: 2014-01-26 | Disposition: A | Discharge status: Home / Self Care | Payer: Self-pay | Source: Home / Self Care | Attending: Family Medicine | Admitting: Family Medicine

## 2014-01-26 DIAGNOSIS — J358 Other chronic diseases of tonsils and adenoids: Secondary | ICD-10-CM

## 2014-01-26 LAB — POCT RAPID STREP A: Streptococcus, Group A Screen (Direct): NEGATIVE

## 2014-01-26 NOTE — Discharge Instructions (Signed)
See ent doctor if further problems.

## 2014-01-26 NOTE — ED Notes (Signed)
C/o recurrent infections of her tonsil

## 2014-01-26 NOTE — ED Provider Notes (Signed)
CSN: [id number]     Arrival date & time 01/26/14  1834 History   First MD Initiated Contact with Patient 01/26/14 1923     Chief Complaint  Patient presents with  . Sore Throat   (Consider location/radiation/quality/duration/timing/severity/associated sxs/prior Treatment) Patient is a 23 y.o. female presenting with pharyngitis. The history is provided by the patient.  Sore Throat This is a chronic problem. The current episode started more than 1 week ago. The problem has not changed since onset.Associated symptoms comments: White debris collects in right tonsil and removed repeatedly by pt..    Past Medical History  Diagnosis Date  . Eczema   . Bursitis   . Arthritis   . Dysmenorrhea   . Chronic constipation   . Iron deficiency anemia   . Scabies infestation    Past Surgical History  Procedure Laterality Date  . Cesarean section     History reviewed. No pertinent family history. History  Substance Use Topics  . Smoking status: Never Smoker   . Smokeless tobacco: Not on file  . Alcohol Use: No   OB History   Grav Para Term Preterm Abortions TAB SAB Ect Mult Living   2 1 1  0 1 0 1 0 0 1     Review of Systems  Constitutional: Negative.   HENT: Positive for sore throat. Negative for trouble swallowing.     Allergies  Almond extract and Zofran  Home Medications   Prior to Admission medications   Medication Sig Start Date End Date Taking? Authorizing Provider  HYDROcodone-acetaminophen (NORCO/VICODIN) 5-325 MG per tablet Take 1 tablet by mouth every 6 (six) hours as needed. 05/30/13   Sunnie NielsenBrian Opitz, MD  naproxen (NAPROSYN) 250 MG tablet Take 250 mg by mouth 2 (two) times daily as needed (pain).    Historical Provider, MD   There were no vitals taken for this visit. Physical Exam  Nursing note and vitals reviewed. Constitutional: She is oriented to person, place, and time. She appears well-developed and well-nourished.  HENT:  Right Ear: External ear normal.  Left  Ear: External ear normal.  Mouth/Throat: Oropharynx is clear and moist.  Eyes: Conjunctivae are normal. Pupils are equal, round, and reactive to light.  Neck: Normal range of motion. Neck supple.  Lymphadenopathy:    She has no cervical adenopathy.  Neurological: She is alert and oriented to person, place, and time.  Skin: Skin is warm and dry.    ED Course  Procedures (including critical care time) Labs Review Labs Reviewed  POCT RAPID STREP A ([facility]ARE ONLY)    Imaging Review No results found.   MDM   1. Tonsillar calculus        Linna HoffJames D Kindl, MD 01/26/14 [phone]

## 2014-01-28 LAB — CULTURE, GROUP A STREP

## 2014-03-12 ENCOUNTER — Encounter (HOSPITAL_COMMUNITY): Payer: Self-pay | Admitting: Emergency Medicine

## 2014-03-12 ENCOUNTER — Emergency Department (HOSPITAL_COMMUNITY)
Admission: EM | Admit: 2014-03-12 | Discharge: 2014-03-12 | Disposition: A | Discharge status: Home / Self Care | Payer: Self-pay | Attending: Emergency Medicine | Admitting: Emergency Medicine

## 2014-03-12 DIAGNOSIS — Z8619 Personal history of other infectious and parasitic diseases: Secondary | ICD-10-CM | POA: Insufficient documentation

## 2014-03-12 DIAGNOSIS — R200 Anesthesia of skin: Secondary | ICD-10-CM

## 2014-03-12 DIAGNOSIS — Z862 Personal history of diseases of the blood and blood-forming organs and certain disorders involving the immune mechanism: Secondary | ICD-10-CM | POA: Insufficient documentation

## 2014-03-12 DIAGNOSIS — Z8739 Personal history of other diseases of the musculoskeletal system and connective tissue: Secondary | ICD-10-CM | POA: Insufficient documentation

## 2014-03-12 DIAGNOSIS — R209 Unspecified disturbances of skin sensation: Secondary | ICD-10-CM | POA: Insufficient documentation

## 2014-03-12 DIAGNOSIS — Z872 Personal history of diseases of the skin and subcutaneous tissue: Secondary | ICD-10-CM | POA: Insufficient documentation

## 2014-03-12 DIAGNOSIS — R202 Paresthesia of skin: Secondary | ICD-10-CM

## 2014-03-12 DIAGNOSIS — R011 Cardiac murmur, unspecified: Secondary | ICD-10-CM | POA: Insufficient documentation

## 2014-03-12 DIAGNOSIS — Z8742 Personal history of other diseases of the female genital tract: Secondary | ICD-10-CM | POA: Insufficient documentation

## 2014-03-12 LAB — BASIC METABOLIC PANEL
Anion gap: 12 (ref 5–15)
BUN: 12 mg/dL (ref 6–23)
CO2: 25 meq/L (ref 19–32)
Calcium: 9 mg/dL (ref 8.4–10.5)
Chloride: 102 mEq/L (ref 96–112)
Creatinine, Ser: 0.73 mg/dL (ref 0.50–1.10)
GFR calc Af Amer: >90 mL/min (ref >90)
GFR calc non Af Amer: >90 mL/min (ref >90)
GLUCOSE: 88 mg/dL (ref 70–99)
Potassium: 4.4 mEq/L (ref 3.7–5.3)
SODIUM: 139 meq/L (ref 137–147)

## 2014-03-12 LAB — CBC WITH DIFFERENTIAL/PLATELET
Basophils Absolute: 0 10*3/uL (ref 0.0–0.1)
Basophils Relative: 0 % (ref 0–1)
EOS PCT: 3 % (ref 0–5)
Eosinophils Absolute: 0.2 10*3/uL (ref 0.0–0.7)
HEMATOCRIT: 37.1 % (ref 36.0–46.0)
Hemoglobin: 12.1 g/dL (ref 12.0–15.0)
LYMPHS ABS: 2.9 10*3/uL (ref 0.7–4.0)
LYMPHS PCT: 39 % (ref 12–46)
MCH: 28.3 pg (ref 26.0–34.0)
MCHC: 32.6 g/dL (ref 30.0–36.0)
MCV: 86.7 fL (ref 78.0–100.0)
Monocytes Absolute: 0.5 10*3/uL (ref 0.1–1.0)
Monocytes Relative: 7 % (ref 3–12)
Neutro Abs: 3.7 10*3/uL (ref 1.7–7.7)
Neutrophils Relative %: 51 % (ref 43–77)
Platelets: 260 10*3/uL (ref 150–400)
RBC: 4.28 MIL/uL (ref 3.87–5.11)
RDW: 12.4 % (ref 11.5–15.5)
WBC: 7.4 10*3/uL (ref 4.0–10.5)

## 2014-03-12 LAB — HCG, SERUM, QUALITATIVE: PREG SERUM: NEGATIVE

## 2014-03-12 LAB — CK: CK TOTAL: 157 U/L (ref 7–177)

## 2014-03-12 LAB — MAGNESIUM: Magnesium: 1.9 mg/dL (ref 1.5–2.5)

## 2014-03-12 MED ORDER — PREDNISONE 20 MG PO TABS: 60.0000 mg | ORAL_TABLET | Freq: Every day | ORAL | Status: DC

## 2014-03-12 MED ORDER — PREDNISONE 20 MG PO TABS
60.0000 mg | ORAL_TABLET | Freq: Once | ORAL | Status: AC
  Administered 2014-03-12: 60 mg via ORAL
  Filled 2014-03-12: qty 3

## 2014-03-12 MED ORDER — HYDROCODONE-ACETAMINOPHEN 5-325 MG PO TABS: 1.0000 | ORAL_TABLET | Freq: Every day | ORAL | Status: DC | PRN

## 2014-03-12 MED ORDER — HYDROCODONE-ACETAMINOPHEN 5-325 MG PO TABS
1.0000 | ORAL_TABLET | Freq: Once | ORAL | Status: AC
  Administered 2014-03-12: 1 via ORAL
  Filled 2014-03-12: qty 1

## 2014-03-12 NOTE — ED Notes (Signed)
Pt reports left side numbness, weakness x 3 months. States that she [facility] in [place] this past Monday and had a complete work up for the numbness and possible MS. States that he is supposed to call tomorrow with the results. States that left leg pain was so severe that she came to the ER this am for pain mgmt. Has not taken any OTC pain meds or iced the leg. Pt alert and oriented x 4, neuro intact, holding young child on left side of stretcher with her.

## 2014-03-12 NOTE — Discharge Instructions (Signed)
Multiple Sclerosis (THIS IS NOT YOUR DIAGNOSIS, THIS IS FOR INFORMATION ONLY) You were seen today for numbness and pain in your legs.  Your symptoms improved with steroids and pain medication. Please take the steroids every day for 5 total days.  Use the pain medication as needed for breakthrough pain.  Follow up with your regular doctor within 3 days for continued diagnosis of your symptoms.  If your symptoms worsen or you develop new symptoms, return to the ED immediately for repeat evaluation. Thank you  Multiple sclerosis (MS) is a disease of the central nervous system. It leads to the loss of the insulating covering of the nerves (myelin sheath) of your brain. When this happens, brain signals do not get sent properly or may not get sent at all. The age of onset of MS varies.  CAUSES The cause of MS is unknown. However, it is more common in the [place] than in the [place]. RISK FACTORS There is a higher number of women with MS than men. MS is not an illness that is passed down to you from your family members (inherited). However, your risk of MS is higher if you have a relative with MS. SIGNS AND SYMPTOMS  The symptoms of MS occur in episodes or attacks. These attacks may last weeks to months. There may be long periods of almost no symptoms between attacks. The symptoms of MS vary. This is because of the many different ways it affects the central nervous system. The main symptoms of MS include:  Vision problems and eye pain.  Numbness.  Weakness.  Inability to move your arms, hands, feet, or legs (paralysis).  Balance problems.  Tremors. DIAGNOSIS  Your health care provider can diagnose MS with the help of imaging exams and lab tests. These may include specialized X-ray exams and spinal fluid tests. The best imaging exam to confirm a diagnosis of MS is an MRI. TREATMENT  There is no known cure for MS, but there are medicines that can decrease the number and  frequency of attacks. Steroids are often used for short-term relief. Physical and occupational therapy may also help. There are also many new alternative or complementary treatments available to help control the symptoms of MS. Ask your health care provider if any of these other options are right for you. HOME CARE INSTRUCTIONS   Take medicines as directed by your health care provider.  Exercise as directed by your health care provider. SEEK MEDICAL CARE IF: You begin to feel depressed. SEEK IMMEDIATE MEDICAL CARE IF:  You develop paralysis.  You have problems with bladder, bowel, or sexual function.  You develop mental changes, such as forgetfulness or mood swings.  You have a period of uncontrolled movements (seizure). Document Released: 06/30/2000 Document Revised: 07/08/2013 Document Reviewed: 03/10/2013 [facility] Patient Information 2015 [place], [place]. This information is not intended to replace advice given to you by your health care provider. Make sure you discuss any questions you have with your health care provider.

## 2014-03-12 NOTE — ED Notes (Signed)
Pt repots numbness in fingers and toes x2 months and for last 2 days its been in R leg. Throbbing sharp pain to R leg as well. Pt seen by pcp on Monday and awaiting lab results.

## 2014-03-12 NOTE — ED Provider Notes (Signed)
CSN: [id number]     Arrival date & time 03/12/14  0150 History   First MD Initiated Contact with Patient 03/12/14 [phone]     Chief Complaint  Patient presents with  . Numbness     (Consider location/radiation/quality/duration/timing/severity/associated sxs/prior Treatment) HPI  Becky Young is a 23 year old female with no significant past medical history coming in with multiple neurological complaints. Patient states she's had numbness in her fingers and toes bilaterally for the past 2 months. Nothing seems to bring her symptoms on or turn her symptoms off. Her primary care physician is aware of her symptoms and is currently working up, including MRI of her brain and evaluation for multiple sclerosis. Today patient states she's having right foot numbness as well as right thigh pain. She denies ever having headache or blurry vision throughout these 2 months. She denies any focal muscle weakness. She denies any fevers recent infections chest pain shortness of breath abdominal pain vomiting or changes in her bowel or urine. She is described as a terminal at home without any relief.  Past Medical History  Diagnosis Date  . Eczema   . Bursitis   . Arthritis   . Dysmenorrhea   . Chronic constipation   . Iron deficiency anemia   . Scabies infestation    Past Surgical History  Procedure Laterality Date  . Cesarean section     No family history on file. History  Substance Use Topics  . Smoking status: Never Smoker   . Smokeless tobacco: Not on file  . Alcohol Use: No   OB History   Grav Para Term Preterm Abortions TAB SAB Ect Mult Living   0 1 0 1 0 0 1     Review of Systems 10 Systems reviewed and are negative for acute change except as noted in the HPI.    Allergies  Almond extract and Zofran  Home Medications   Prior to Admission medications   Not on File   BP 100/47  Pulse 65  Temp(Src) 98 F (36.7 C) (Oral)  Resp 20  Ht  (1.6 m)  Wt 166 lb (75.297 kg)  BMI  29.41 kg/m2  SpO2 100%  LMP 02/15/2014 Physical Exam  Nursing note and vitals reviewed. Constitutional: She is oriented to person, place, and time. She appears well-developed and well-nourished. No distress.  HENT:  Head: Normocephalic and atraumatic.  Nose: Nose normal.  Mouth/Throat: Oropharynx is clear and moist. No oropharyngeal exudate.  Eyes: Conjunctivae and EOM are normal. Pupils are equal, round, and reactive to light. No scleral icterus.  Neck: Normal range of motion. Neck supple. No JVD present. No tracheal deviation present. No thyromegaly present.  Cardiovascular: Normal rate and regular rhythm.  Exam reveals no gallop and no friction rub.   Murmur heard. Systolic murmur present.  Pulmonary/Chest: Effort normal and breath sounds normal. No respiratory distress. She has no wheezes. She exhibits no tenderness.  Abdominal: Soft. Bowel sounds are normal. She exhibits no distension and no mass. There is no tenderness. There is no rebound and no guarding.  Musculoskeletal: Normal range of motion. She exhibits no edema and no tenderness.  Lymphadenopathy:    She has no cervical adenopathy.  Neurological: She is alert and oriented to person, place, and time. She has normal reflexes. She displays normal reflexes. No cranial nerve deficit. She exhibits normal muscle tone.  Normal strength and sensation x4 extremities.  Skin: Skin is warm and dry. No rash noted. She is not diaphoretic.  No erythema. No pallor.    ED Course  Procedures (including critical care time) Labs Review Labs Reviewed  CBC WITH DIFFERENTIAL  BASIC METABOLIC PANEL  HCG, SERUM, QUALITATIVE  CK  MAGNESIUM    Imaging Review No results found.   EKG Interpretation None      MDM   Final diagnoses:  None     Patient presents emergency department for concern of her right thigh pain in right large toe the numbness. On my physical exam I cannot elicit true tenderness at her right thigh nor can I elicit  paresthesias in her right foot. Patient states the symptoms wax and wane. Patient is already getting a workup for her neurological complaints. Multiple sclerosis is a likely diagnosis for her thus she was given prednisone in the emergency department. She was also treated with Norco for pain., Repeat assessment patient was resting comfortably in bed in no acute distress. She states that her pain had resolved.  Difficult to know whether the prednisone or Norco relieved her pain. To be discharged with a 4 day course of prednisone as well as Norco for breakthrough pain. Patient is advised to follow with her doctor regarding a neurological workup. Patient had systolic blood pressure of 97 while she was asleep. Otherwise her vital signs remained stable and she is safe for discharge.  Tomasita Crumble, MD 03/12/14 [phone]

## 2014-05-18 ENCOUNTER — Encounter (HOSPITAL_COMMUNITY): Payer: Self-pay | Admitting: Emergency Medicine

## 2014-11-22 IMAGING — CT CT ABD-PELV W/ CM
1 of 2 series · 15 of 32 positions shown, 19 images · IV contrast (omnipaque)
Comparison: CT of the abdomen and pelvis performed 07/29/2011, and
pelvic ultrasound performed 12/03/2012

CLINICAL DATA: Lower abdominal pain and back pain.

EXAM:
CT ABDOMEN AND PELVIS WITH CONTRAST
TECHNIQUE: Multidetector CT imaging of the abdomen and pelvis was performed
using the standard protocol following bolus administration of
intravenous contrast.
CONTRAST:  100mL OMNIPAQUE IOHEXOL 300 MG/ML  SOLN

[Series 2: abd/pel with · axial · 0.61mm/px · z∈[+1471,+1831]mm · 15 of 80 slices shown, 19 images]
[im 4/80  soft-tissue]
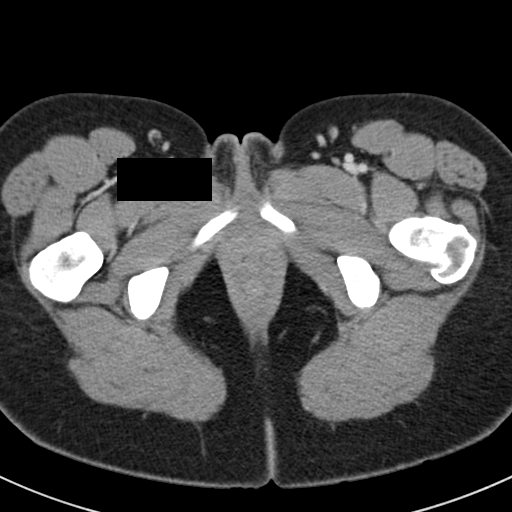
[im 4/80  bone]
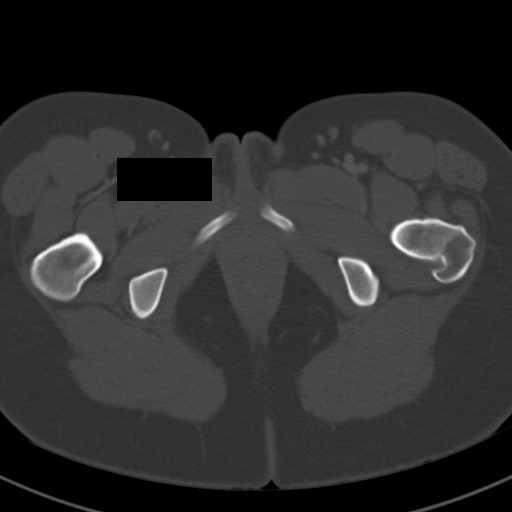
[im 10/80  soft-tissue]
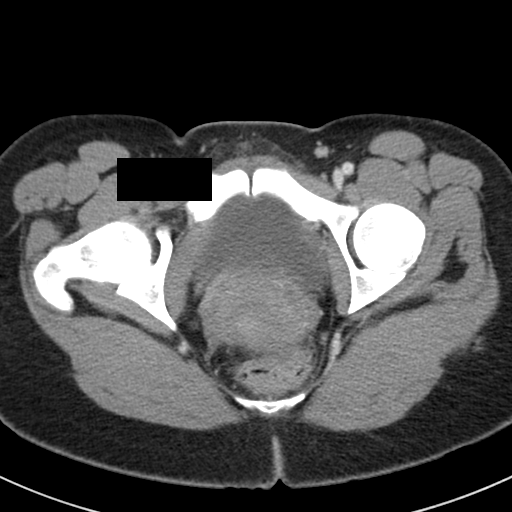
[im 16/80  soft-tissue]
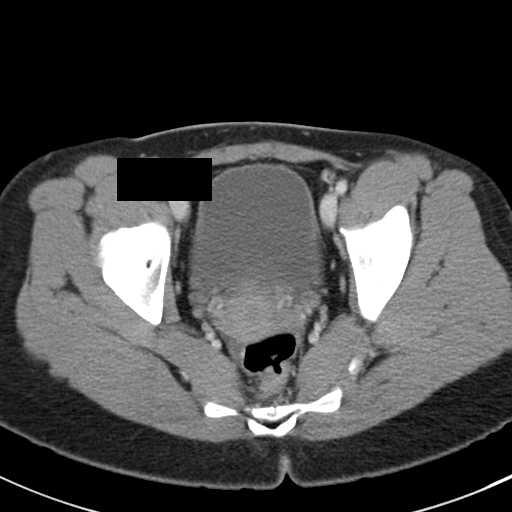
[im 23/80  soft-tissue]
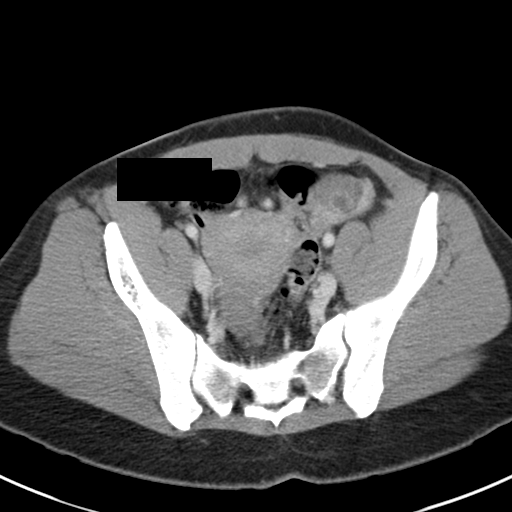
[im 29/80  soft-tissue]
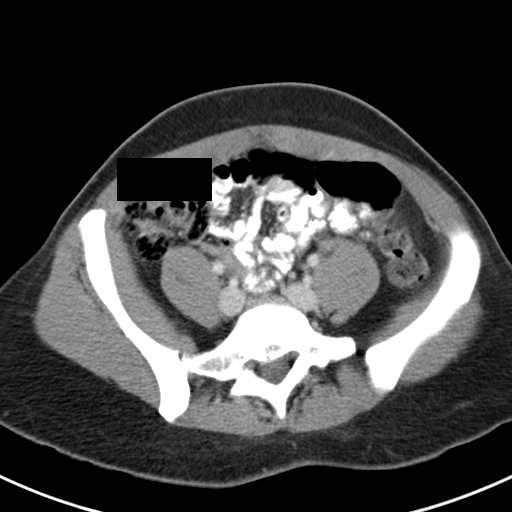
[im 35/80  soft-tissue]
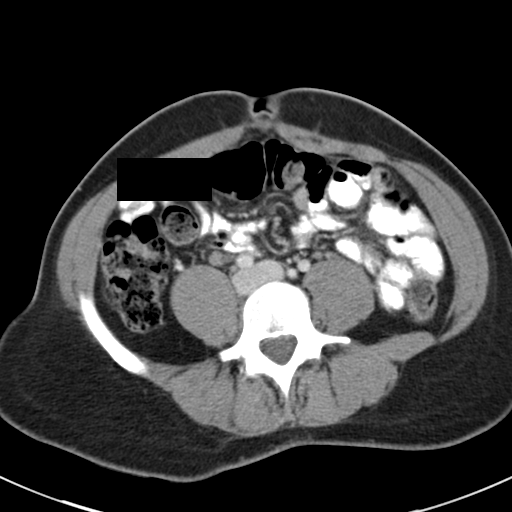
[im 42/80  soft-tissue]
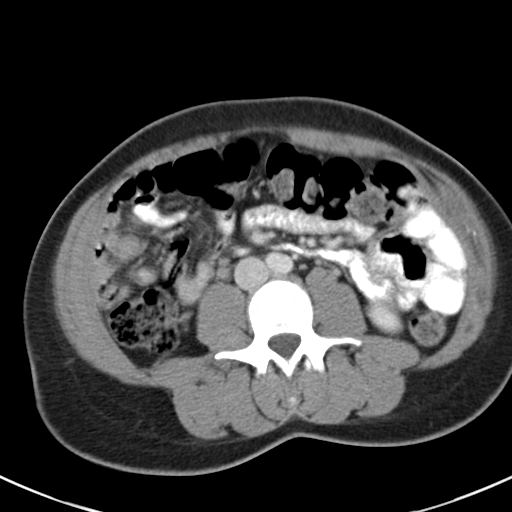
[im 45/80  soft-tissue]
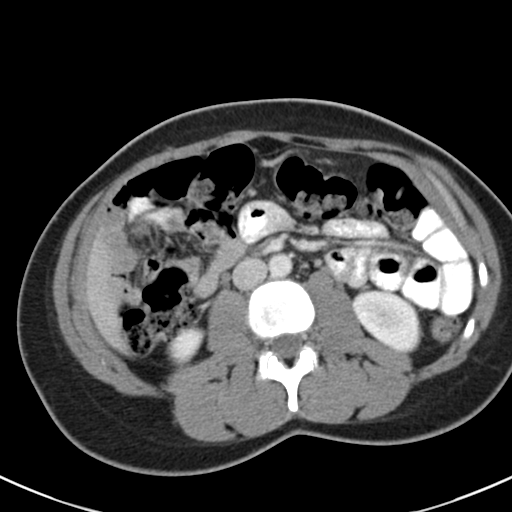
[im 51/80  soft-tissue]
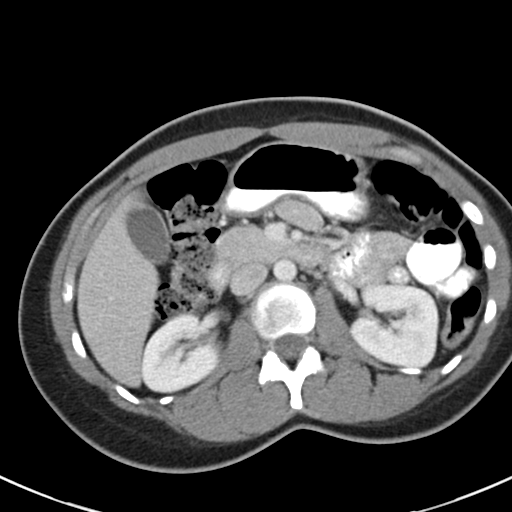
[im 51/80  bone]
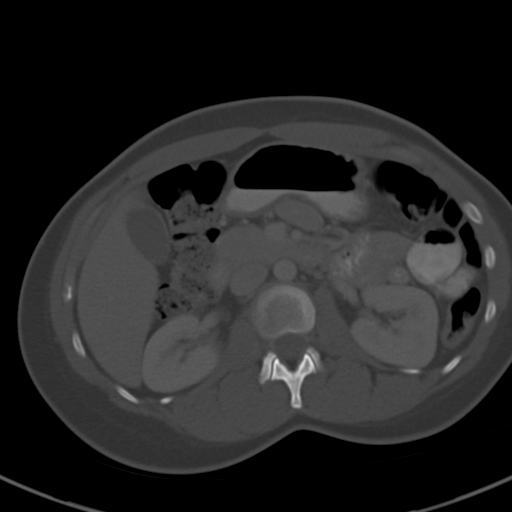
[im 57/80  soft-tissue]
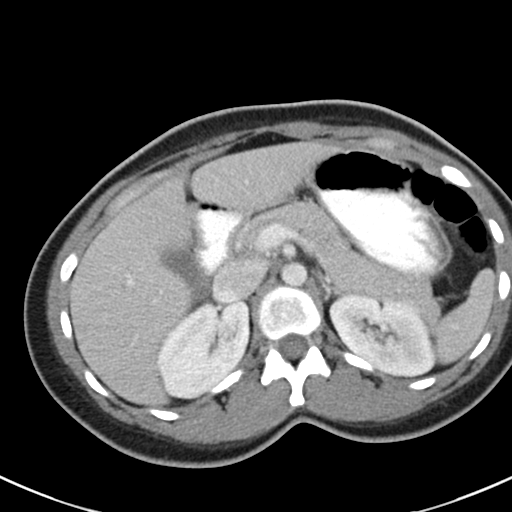
[im 64/80  soft-tissue]
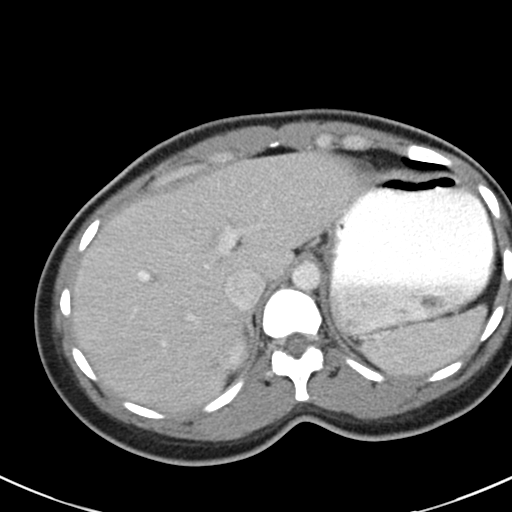
[im 67/80  lung]
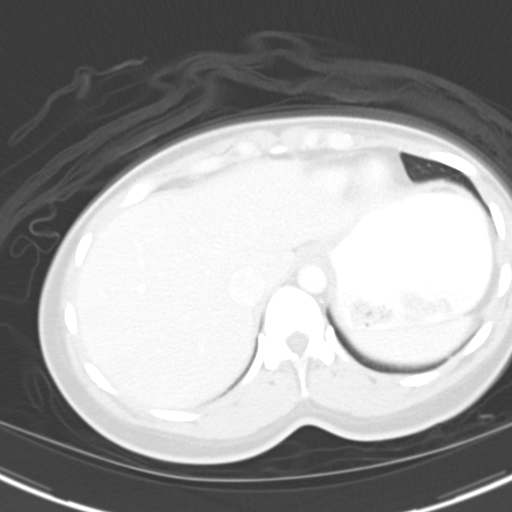
[im 70/80  soft-tissue]
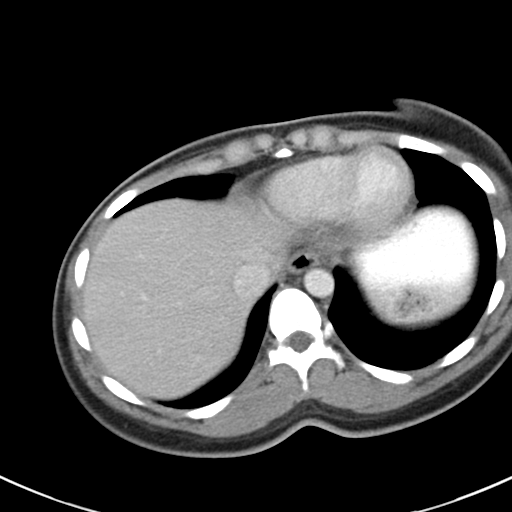
[im 70/80  lung]
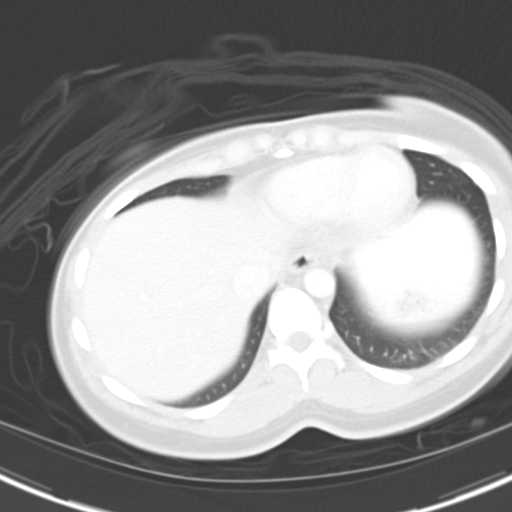
[im 73/80  lung]
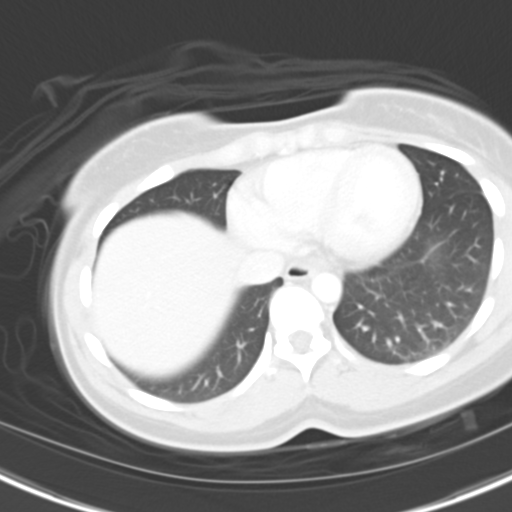
[im 76/80  soft-tissue]
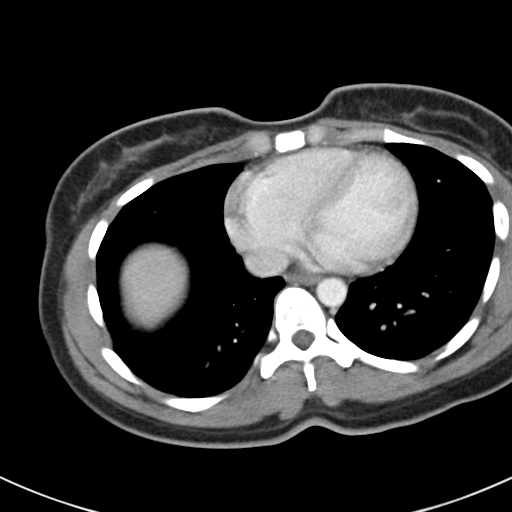
[im 76/80  lung]
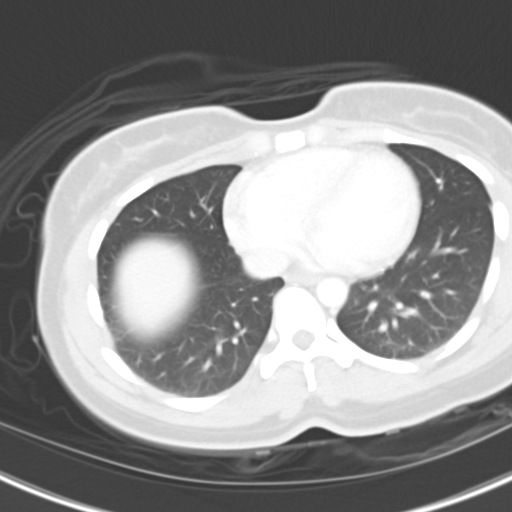

[15 of 32 positions shown; findings below may reference images not displayed]

FINDINGS: The visualized lung bases are clear.

The previously noted tiny hepatic hypodensity is less well
characterized on the current study and likely benign, though a
somewhat larger 4 mm hypodensity is noted at the inferior tip of the
liver, better characterized than on the prior study. The liver and
spleen are otherwise unremarkable in appearance. The gallbladder is
within normal limits. The pancreas and adrenal glands are
unremarkable.

The kidneys are unremarkable in appearance. There is no evidence of
hydronephrosis. No renal or ureteral stones are seen. No perinephric
stranding is appreciated.

The small bowel is unremarkable in appearance. The stomach is within
normal limits. No acute vascular abnormalities are seen.

The appendix is normal in caliber and contains air, without evidence
for appendicitis. The colon is unremarkable in appearance.

The bladder is mildly distended and grossly unremarkable. The uterus
is within normal limits. The left ovary is slightly larger than the
right, but no suspicious adnexal masses are seen. Trace free fluid
within the pelvis is likely physiologic in nature. No inguinal
lymphadenopathy is seen.

No acute osseous abnormalities are identified.
IMPRESSION: 1. No acute abnormality seen within the abdomen or pelvis.
2. Likely tiny hepatic cysts.

## 2014-11-26 ENCOUNTER — Inpatient Hospital Stay (HOSPITAL_COMMUNITY)
Admission: AD | Admit: 2014-11-26 | Discharge: 2014-11-26 | Disposition: A | Discharge status: Home / Self Care | Payer: Self-pay | Source: Ambulatory Visit | Attending: Obstetrics & Gynecology | Admitting: Obstetrics & Gynecology

## 2014-11-26 ENCOUNTER — Inpatient Hospital Stay (HOSPITAL_COMMUNITY): Payer: Self-pay

## 2014-11-26 ENCOUNTER — Encounter (HOSPITAL_COMMUNITY): Payer: Self-pay | Admitting: *Deleted

## 2014-11-26 DIAGNOSIS — M545 Low back pain, unspecified: Secondary | ICD-10-CM

## 2014-11-26 DIAGNOSIS — R1032 Left lower quadrant pain: Secondary | ICD-10-CM | POA: Insufficient documentation

## 2014-11-26 LAB — URINALYSIS, ROUTINE W REFLEX MICROSCOPIC
Bilirubin Urine: NEGATIVE
GLUCOSE, UA: NEGATIVE mg/dL
Hgb urine dipstick: NEGATIVE
Ketones, ur: NEGATIVE mg/dL
LEUKOCYTES UA: NEGATIVE
Nitrite: NEGATIVE
PH: 6 (ref 5.0–8.0)
Protein, ur: NEGATIVE mg/dL
Specific Gravity, Urine: 1.02 (ref 1.005–1.030)
Urobilinogen, UA: 0.2 mg/dL (ref 0.0–1.0)

## 2014-11-26 LAB — POCT PREGNANCY, URINE: PREG TEST UR: NEGATIVE

## 2014-11-26 MED ORDER — CYCLOBENZAPRINE HCL 10 MG PO TABS: 10.0000 mg | ORAL_TABLET | Freq: Three times a day (TID) | ORAL | Status: DC | PRN

## 2014-11-26 MED ORDER — KETOROLAC TROMETHAMINE 60 MG/2ML IM SOLN
60.0000 mg | Freq: Once | INTRAMUSCULAR | Status: AC
  Administered 2014-11-26: 60 mg via INTRAMUSCULAR
  Filled 2014-11-26: qty 2

## 2014-11-26 NOTE — Discharge Instructions (Signed)
Back Pain, Adult Low back pain is very common. About 1 in 5 people have back pain.The cause of low back pain is rarely dangerous. The pain often gets better over time.About half of people with a sudden onset of back pain feel better in just 2 weeks. About 8 in 10 people feel better by 6 weeks.  CAUSES Some common causes of back pain include:  Strain of the muscles or ligaments supporting the spine.  Wear and tear (degeneration) of the spinal discs.  Arthritis.  Direct injury to the back. DIAGNOSIS Most of the time, the direct cause of low back pain is not known.However, back pain can be treated effectively even when the exact cause of the pain is unknown.Answering your caregiver's questions about your overall health and symptoms is one of the most accurate ways to make sure the cause of your pain is not dangerous. If your caregiver needs more information, he or she may order lab work or imaging tests (X-rays or MRIs).However, even if imaging tests show changes in your back, this usually does not require surgery. HOME CARE INSTRUCTIONS For many people, back pain returns.Since low back pain is rarely dangerous, it is often a condition that people can learn to manageon their own.   Remain active. It is stressful on the back to sit or stand in one place. Do not sit, drive, or stand in one place for more than 30 minutes at a time. Take short walks on level surfaces as soon as pain allows.Try to increase the length of time you walk each day.  Do not stay in bed.Resting more than 1 or 2 days can delay your recovery.  Do not avoid exercise or work.Your body is made to move.It is not dangerous to be active, even though your back may hurt.Your back will likely heal faster if you return to being active before your pain is gone.  Pay attention to your body when you bend and lift. Many people have less discomfortwhen lifting if they bend their knees, keep the load close to their bodies,and  avoid twisting. Often, the most comfortable positions are those that put less stress on your recovering back.  Find a comfortable position to sleep. Use a firm mattress and lie on your side with your knees slightly bent. If you lie on your back, put a pillow under your knees.  Only take over-the-counter or prescription medicines as directed by your caregiver. Over-the-counter medicines to reduce pain and inflammation are often the most helpful.Your caregiver may prescribe muscle relaxant drugs.These medicines help dull your pain so you can more quickly return to your normal activities and healthy exercise.  Put ice on the injured area.  Put ice in a plastic bag.  Place a towel between your skin and the bag.  Leave the ice on for 15-20 minutes, 03-04 times a day for the first 2 to 3 days. After that, ice and heat may be alternated to reduce pain and spasms.  Ask your caregiver about trying back exercises and gentle massage. This may be of some benefit.  Avoid feeling anxious or stressed.Stress increases muscle tension and can worsen back pain.It is important to recognize when you are anxious or stressed and learn ways to manage it.Exercise is a great option. SEEK MEDICAL CARE IF:  You have pain that is not relieved with rest or medicine.  You have pain that does not improve in 1 week.  You have new symptoms.  You are generally not feeling well. SEEK   IMMEDIATE MEDICAL CARE IF:   You have pain that radiates from your back into your legs.  You develop new bowel or bladder control problems.  You have unusual weakness or numbness in your arms or legs.  You develop nausea or vomiting.  You develop abdominal pain.  You feel faint. Document Released: 07/03/2005 Document Revised: 01/02/2012 Document Reviewed: 11/04/2013 [employer] Patient Information 2015 [employer]. This information is not intended to replace advice given to you by your health care provider. Make sure you  discuss any questions you have with your health care provider.  

## 2014-11-26 NOTE — MAU Provider Note (Signed)
History     CSN: [id number]  Arrival date and time: 11/26/14 1613   None     Chief Complaint  Patient presents with  . Back Pain  . Abdominal Pain   Abdominal Pain This is a recurrent problem. The current episode started yesterday. The onset quality is gradual. The problem occurs constantly. The problem has been unchanged. The pain is located in the LLQ. The pain is moderate. The quality of the pain is aching and cramping. The abdominal pain radiates to the back. Pertinent negatives include no anorexia, constipation, diarrhea, dysuria, fever, frequency, headaches, myalgias, nausea or vomiting. Nothing aggravates the pain. The pain is relieved by nothing. She has tried nothing for the symptoms.   [name] who presents with "excruciating pain in my kidneys and left lower abdomen".  Has had multiple workups for this and states her gastroenterologist "always sends me to my GYN at the [place]".  States has had ovarian cysts on left. Points to lower back regarding "kidney pain".   RN Note: Pt presents to MAU with complaints of pain in her lower abdomen radiating into her lower back. Denies any vaginal bleeding         Prior ED note from 2014 Becky Young is a 24 y.o. female who presents to emergency department complaining of lower abdominal pain and back pain. Patient states her symptoms began 3 days ago. States pain is mainly in the lower abdomen. States feels "crampy and sharp." She states she currently is undergoing evaluation by gastroenterologist for "diffuse abdominal pain and food allergies, states had an endoscopy colonoscopy was told that she may have food allergies but was not given a diagnosis. Patient states this does not feel like the pain that she has had in the past. She denies any urinary or vaginal symptoms. She denies any nausea or vomiting. She states she had a normal bowel movement this morning. She denies any fever. She did not take any medications for this. She  denies being pregnant, last period 2 weeks ago.  OB History    Gravida Para Term Preterm AB TAB SAB Ectopic Multiple Living   0 1 0 1 0 0 1      Past Medical History  Diagnosis Date  . Eczema   . Bursitis   . Arthritis   . Dysmenorrhea   . Chronic constipation   . Iron deficiency anemia   . Scabies infestation     Past Surgical History  Procedure Laterality Date  . Cesarean section      No family history on file.  History  Substance Use Topics  . Smoking status: Never Smoker   . Smokeless tobacco: Not on file  . Alcohol Use: No    Allergies:  Allergies  Allergen Reactions  . Almond Extract Shortness Of Breath and Swelling  . Zofran Hives and Nausea And Vomiting    Pt unsure, but was given medication for nausea and developed hives. She believes it was zofran    Prescriptions prior to admission  Medication Sig Dispense Refill Last Dose  . HYDROcodone-acetaminophen (NORCO/VICODIN) 5-325 MG per tablet Take 1 tablet by mouth daily as needed for severe pain. 4 tablet 0   . predniSONE (DELTASONE) 20 MG tablet Take 3 tablets (60 mg total) by mouth daily. 12 tablet 0     Review of Systems  Constitutional: Negative for fever, chills and malaise/fatigue.  Respiratory: Negative for shortness of breath.   Cardiovascular: Negative  for chest pain.  Gastrointestinal: Positive for abdominal pain. Negative for nausea, vomiting, diarrhea, constipation and anorexia.  Genitourinary: Negative for dysuria and frequency.  Musculoskeletal: Positive for back pain (low back). Negative for myalgias.  Neurological: Negative for focal weakness and headaches.   Physical Exam   Blood pressure 124/67, pulse 80, temperature 98.6 F (37 C), temperature source Oral, resp. rate 18, height 5\' 4"  (1.626 m), weight 166 lb (75.297 kg).  Physical Exam  Constitutional: She is oriented to person, place, and time. She appears well-developed and well-nourished. No distress.  Cardiovascular:  Normal rate and regular rhythm.  Exam reveals no gallop and no friction rub.   No murmur heard. Respiratory: Effort normal and breath sounds normal. No respiratory distress. She has no wheezes. She has no rales.  GI: Soft. She exhibits no distension and no mass. There is tenderness (mildly tender LLQ, no rebound or guarding). There is no rebound and no guarding.  Genitourinary: Vagina normal. No vaginal discharge found.  Cervix long and closed Uterus small and nontender Left adnexa slightly tender Right adnexa nontneder  Musculoskeletal: Normal range of motion.  Neurological: She is alert and oriented to person, place, and time.  Skin: Skin is warm and dry.  Psychiatric: She has a normal mood and affect.    MAU Course  Procedures  MDM Results for PASSModena Young, Becky Young (MRN [id number]) as of 12/01/2014 01:15  Ref. Range 11/26/2014 16:30  Appearance Latest Ref Range: CLEAR  CLEAR  Bilirubin Urine Latest Ref Range: NEGATIVE  NEGATIVE  Color, Urine Latest Ref Range: YELLOW  YELLOW  Glucose Latest Ref Range: NEGATIVE mg/dL NEGATIVE  Hgb urine dipstick Latest Ref Range: NEGATIVE  NEGATIVE  Ketones, ur Latest Ref Range: NEGATIVE mg/dL NEGATIVE  Leukocytes, UA Latest Ref Range: NEGATIVE  NEGATIVE  Nitrite Latest Ref Range: NEGATIVE  NEGATIVE  pH Latest Ref Range: 5.0-8.0  6.0  Protein Latest Ref Range: NEGATIVE mg/dL NEGATIVE  Specific Gravity, Urine Latest Ref Range: 1.005-1.030  1.020  Urobilinogen, UA Latest Ref Range: 0.0-1.0 mg/dL 0.2  Preg Test, Ur Latest Ref Range: NEGATIVE  NEGATIVE  [place] Transvaginal Non-ob  11/26/2014   CLINICAL DATA:  Chronic left pelvic pain, worse over the past 3 days.  EXAM: TRANSABDOMINAL AND TRANSVAGINAL ULTRASOUND OF PELVIS  TECHNIQUE: Both transabdominal and transvaginal ultrasound examinations of the pelvis were performed. Transabdominal technique was performed for global imaging of the pelvis including uterus, ovaries, adnexal regions, and pelvic cul-de-sac. It was  necessary to proceed with endovaginal exam following the transabdominal exam to visualize the ovaries.  COMPARISON:  None  FINDINGS: Uterus  Measurements: 9.2 x 4.1 x 5.7 cm. No fibroids or other mass visualized.  Endometrium  Thickness: 7.8 mm.  No focal abnormality visualized.  Right ovary  Measurements: 4.6 x 2.5 x 2.2 cm. Normal appearance/no adnexal mass.  Left ovary  Measurements: 5.0 x 3.2 x 2.4 cm. Normal appearance/no adnexal mass.  Other findings  No free fluid.  IMPRESSION: Normal   Electronically Signed   By: Ellery Plunkaniel R Mitchell M.D.   On: 11/26/2014 18:13   [place] Pelvis Complete  11/26/2014   CLINICAL DATA:  Chronic left pelvic pain, worse over the past 3 days.  EXAM: TRANSABDOMINAL AND TRANSVAGINAL ULTRASOUND OF PELVIS  TECHNIQUE: Both transabdominal and transvaginal ultrasound examinations of the pelvis were performed. Transabdominal technique was performed for global imaging of the pelvis including uterus, ovaries, adnexal regions, and pelvic cul-de-sac. It was necessary to proceed with endovaginal exam following the transabdominal exam to visualize  the ovaries.  COMPARISON:  None  FINDINGS: Uterus  Measurements: 9.2 x 4.1 x 5.7 cm. No fibroids or other mass visualized.  Endometrium  Thickness: 7.8 mm.  No focal abnormality visualized.  Right ovary  Measurements: 4.6 x 2.5 x 2.2 cm. Normal appearance/no adnexal mass.  Left ovary  Measurements: 5.0 x 3.2 x 2.4 cm. Normal appearance/no adnexal mass.  Other findings  No free fluid.  IMPRESSION: Normal   Electronically Signed   By: Ellery Plunkaniel R Mitchell M.D.   On: 11/26/2014 18:13     Assessment and Plan  A:  Abdominal pain in LL quadrant, unknown origin      Low back pain      No evidence of ovarian cyst      Normal Urinalysis  P:  DIscussed findings      Discharge home       States Toradol did not help       Return to her primary doctor for further workup  [facility]ILL[facility]AMS,Meris Reede 11/26/2014, 4:33 PM

## 2014-11-26 NOTE — MAU Note (Signed)
Pt asking about CBG test, feels light head

## 2014-11-26 NOTE — MAU Note (Signed)
Pt presents to MAU with complaints of pain in her lower abdomen radiating into her lower back. Denies any vaginal bleeding

## 2015-02-01 ENCOUNTER — Emergency Department (HOSPITAL_COMMUNITY): Payer: Self-pay

## 2015-02-01 ENCOUNTER — Encounter (HOSPITAL_COMMUNITY): Payer: Self-pay | Admitting: Emergency Medicine

## 2015-02-01 ENCOUNTER — Emergency Department (HOSPITAL_COMMUNITY)
Admission: EM | Admit: 2015-02-01 | Discharge: 2015-02-01 | Disposition: A | Discharge status: Home / Self Care | Payer: Medicaid Other | Attending: Emergency Medicine | Admitting: Emergency Medicine

## 2015-02-01 DIAGNOSIS — S92521A Displaced fracture of medial phalanx of right lesser toe(s), initial encounter for closed fracture: Secondary | ICD-10-CM | POA: Insufficient documentation

## 2015-02-01 DIAGNOSIS — M1990 Unspecified osteoarthritis, unspecified site: Secondary | ICD-10-CM | POA: Insufficient documentation

## 2015-02-01 DIAGNOSIS — Z8742 Personal history of other diseases of the female genital tract: Secondary | ICD-10-CM | POA: Insufficient documentation

## 2015-02-01 DIAGNOSIS — Z8719 Personal history of other diseases of the digestive system: Secondary | ICD-10-CM | POA: Insufficient documentation

## 2015-02-01 DIAGNOSIS — Z8619 Personal history of other infectious and parasitic diseases: Secondary | ICD-10-CM | POA: Insufficient documentation

## 2015-02-01 DIAGNOSIS — W109XXA Fall (on) (from) unspecified stairs and steps, initial encounter: Secondary | ICD-10-CM | POA: Insufficient documentation

## 2015-02-01 DIAGNOSIS — Y9289 Other specified places as the place of occurrence of the external cause: Secondary | ICD-10-CM | POA: Insufficient documentation

## 2015-02-01 DIAGNOSIS — Z872 Personal history of diseases of the skin and subcutaneous tissue: Secondary | ICD-10-CM | POA: Insufficient documentation

## 2015-02-01 DIAGNOSIS — S92501A Displaced unspecified fracture of right lesser toe(s), initial encounter for closed fracture: Secondary | ICD-10-CM

## 2015-02-01 DIAGNOSIS — Y998 Other external cause status: Secondary | ICD-10-CM | POA: Insufficient documentation

## 2015-02-01 DIAGNOSIS — Z862 Personal history of diseases of the blood and blood-forming organs and certain disorders involving the immune mechanism: Secondary | ICD-10-CM | POA: Insufficient documentation

## 2015-02-01 DIAGNOSIS — Y9301 Activity, walking, marching and hiking: Secondary | ICD-10-CM | POA: Insufficient documentation

## 2015-02-01 MED ORDER — MELOXICAM 15 MG PO TABS: 15.0000 mg | ORAL_TABLET | Freq: Every day | ORAL | Status: DC

## 2015-02-01 NOTE — ED Notes (Signed)
Patient complains of mechanical fall after missing a step.   Patient complains of R 5th toe pain.   Patient denies other injuries.

## 2015-02-01 NOTE — ED Provider Notes (Signed)
CSN: [id number]     Arrival date & time 02/01/15  1328 History  This chart was scribed for non-physician practitioner, Lottie Musselatyana A Sarha Bartelt, PA-C, working with Mancel BaleElliott Wentz, MD by Charline BillsEssence Howell, ED Scribe. This patient was seen in room TR08C/TR08C and the patient's care was started at 2:03 PM.   Chief Complaint  Patient presents with  . Fall   The history is provided by the patient. No language interpreter was used.   HPI Comments: Becky Young is a 24 y.o. female who presents to the Emergency Department complaining of a mechanical fall that occurred last night. Pt states that she was walking down stairs when she missed the last step and fell. She reports secondary constant right little toe pain that is exacerbated with bearing weight and palpation. She has tried hydrocodone with temporary relief. No other alleviating or aggravating factors.  Past Medical History  Diagnosis Date  . Eczema   . Bursitis   . Arthritis   . Dysmenorrhea   . Chronic constipation   . Iron deficiency anemia   . Scabies infestation    Past Surgical History  Procedure Laterality Date  . Cesarean section     No family history on file. History  Substance Use Topics  . Smoking status: Never Smoker   . Smokeless tobacco: Not on file  . Alcohol Use: No   OB History    Gravida Para Term Preterm AB TAB SAB Ectopic Multiple Living   2 1 1  0 1 0 1 0 0 1     Review of Systems  Musculoskeletal: Positive for arthralgias.  Neurological: Negative for weakness and numbness.   Allergies  Almond extract and Zofran  Home Medications   Prior to Admission medications   Medication Sig Start Date End Date Taking? Authorizing Provider  cyclobenzaprine (FLEXERIL) 10 MG tablet Take 1 tablet (10 mg total) by mouth 3 (three) times daily as needed for muscle spasms. 11/26/14   Aviva SignsMarie L Williams, CNM  Dicyclomine HCl (BENTYL PO) Take 1 tablet by mouth as needed (pain).    Historical Provider, MD  HYDROcodone-acetaminophen  (NORCO/VICODIN) 5-325 MG per tablet Take 1 tablet by mouth daily as needed for severe pain. 03/12/14   Tomasita CrumbleAdeleke Oni, MD  predniSONE (DELTASONE) 20 MG tablet Take 3 tablets (60 mg total) by mouth daily. Patient not taking: Reported on 11/26/2014 03/12/14   Tomasita CrumbleAdeleke Oni, MD  PRESCRIPTION MEDICATION Apply 1 application topically at bedtime. Medication for eczema.    Historical Provider, MD   BP 124/69 mmHg  Pulse 93  Temp(Src) 98.8 F (37.1 C) (Oral)  Resp 16  Ht 5\' 2"  (1.575 m)  Wt 165 lb (74.844 kg)  BMI 30.17 kg/m2  SpO2 100%  LMP 01/23/2015 Physical Exam  Constitutional: She is oriented to person, place, and time. She appears well-developed and well-nourished. No distress.  HENT:  Head: Normocephalic and atraumatic.  Eyes: Conjunctivae and EOM are normal.  Neck: Neck supple. No tracheal deviation present.  Cardiovascular: Normal rate.   Pulmonary/Chest: Effort normal. No respiratory distress.  Musculoskeletal: Normal range of motion.  Bruising noted to the fifth toe. Tender to palpation over entire fifth right toe. Pain with any range of motion at MTP and IP joints. No tenderness over fifth metatarsal or any other parts of the foot. Dorsal pedal pulses intact. Cap refill less than 2 seconds distally over the toe. Sensation is intact and fifth toe  Neurological: She is alert and oriented to person, place, and time.  Skin:  Skin is warm and dry.  Psychiatric: She has a normal mood and affect. Her behavior is normal.  Nursing note and vitals reviewed.  ED Course  Procedures (including critical care time) DIAGNOSTIC STUDIES: Oxygen Saturation is 100% on RA, normal by my interpretation.    COORDINATION OF CARE: 2:04 PM-Discussed treatment plan which includes XR with pt at bedside and pt agreed to plan.   Labs Review Labs Reviewed - No data to display  Imaging Review Dg Toe 5th Right  02/01/2015   CLINICAL DATA:  Pain and bruising to the fifth toe after fall. Initial encounter.  EXAM:  RIGHT FIFTH TOE  COMPARISON:  None.  FINDINGS: Fracture lucency through the distal aspect of the fifth middle phalanx, extending to the distal interphalangeal joint. The fracture fragment is mildly displaced medially.  IMPRESSION: Mildly displaced fifth middle phalanx fracture with DIP extension.   Electronically Signed   By: Marnee Spring M.D.   On: 02/01/2015 14:07    EKG Interpretation None      MDM   Final diagnoses:  Fracture of fifth toe, right, closed, initial encounter    Patient with fifth toe injury after a fall. No other injury during a fall. X-ray showed mildly displaced fifth middle phalanx fracture with DIP extension. Will buddy tape, postop shoe. Patient has hydrocodone that she takes at home, will continue that. We'll add mobic. Follow with orthopedics only as needed.  Filed Vitals:   02/01/15 1332  BP: 124/69  Pulse: 93  Temp: 98.8 F (37.1 C)  TempSrc: Oral  Resp: 16  Height:  (1.575 m)  Weight: 165 lb (74.844 kg)  SpO2: 100%    I personally performed the services described in this documentation, which was scribed in my presence. The recorded information has been reviewed and is accurate.    Jaynie Crumble, PA-C 02/01/15 1457  Mancel Bale, MD 02/01/15 1640

## 2015-02-01 NOTE — ED Notes (Signed)
Patient denied need for medication.

## 2015-02-01 NOTE — Discharge Instructions (Signed)
Keep your foot elevated at home, ice several times a day. Postop shoe and buddy tape for 4 weeks. Follow up with orthopedic specialist only as needed. Take motor vehicle for pain, continue hydrocodone for severe pain.   Toe Fracture Your caregiver has diagnosed you as having a fractured toe. A toe fracture is a break in the bone of a toe. "Buddy taping" is a way of splinting your broken toe, by taping the broken toe to the toe next to it. This "buddy taping" will keep the injured toe from moving beyond normal range of motion. Buddy taping also helps the toe heal in a more normal alignment. It may take 6 to 8 weeks for the toe injury to heal. HOME CARE INSTRUCTIONS   Leave your toes taped together for as long as directed by your caregiver or until you see a doctor for a follow-up examination. You can change the tape after bathing. Always use a small piece of gauze or cotton between the toes when taping them together. This will help the skin stay dry and prevent infection.  Apply ice to the injury for 15-20 minutes each hour while awake for the first 2 days. Put the ice in a plastic bag and place a towel between the bag of ice and your skin.  After the first 2 days, apply heat to the injured area. Use heat for the next 2 to 3 days. Place a heating pad on the foot or soak the foot in warm water as directed by your caregiver.  Keep your foot elevated as much as possible to lessen swelling.  Wear sturdy, supportive shoes. The shoes should not pinch the toes or fit tightly against the toes.  Your caregiver may prescribe a rigid shoe if your foot is very swollen.  Your may be given crutches if the pain is too great and it hurts too much to walk.  Only take over-the-counter or prescription medicines for pain, discomfort, or fever as directed by your caregiver.  If your caregiver has given you a follow-up appointment, it is very important to keep that appointment. Not keeping the appointment could  result in a chronic or permanent injury, pain, and disability. If there is any problem keeping the appointment, you must call back to this facility for assistance. SEEK MEDICAL CARE IF:   You have increased pain or swelling, not relieved with medications.  The pain does not get better after 1 week.  Your injured toe is cold when the others are warm. SEEK IMMEDIATE MEDICAL CARE IF:   The toe becomes cold, numb, or white.  The toe becomes hot (inflamed) and red. Document Released: 06/30/2000 Document Revised: 09/25/2011 Document Reviewed: 02/17/2008 [facility]ExitCare Patient Information 2015 [employer], [place]. This information is not intended to replace advice given to you by your health care provider. Make sure you discuss any questions you have with your health care provider.

## 2015-04-23 ENCOUNTER — Encounter (HOSPITAL_COMMUNITY): Payer: Self-pay | Admitting: *Deleted

## 2015-04-23 ENCOUNTER — Inpatient Hospital Stay (HOSPITAL_COMMUNITY)
Admission: AD | Admit: 2015-04-23 | Discharge: 2015-04-23 | Disposition: A | Discharge status: Home / Self Care | Payer: Self-pay | Source: Ambulatory Visit | Attending: Obstetrics & Gynecology | Admitting: Obstetrics & Gynecology

## 2015-04-23 ENCOUNTER — Inpatient Hospital Stay (HOSPITAL_COMMUNITY): Payer: Medicaid Other

## 2015-04-23 DIAGNOSIS — O26891 Other specified pregnancy related conditions, first trimester: Secondary | ICD-10-CM | POA: Insufficient documentation

## 2015-04-23 DIAGNOSIS — L299 Pruritus, unspecified: Secondary | ICD-10-CM

## 2015-04-23 DIAGNOSIS — O26899 Other specified pregnancy related conditions, unspecified trimester: Secondary | ICD-10-CM

## 2015-04-23 DIAGNOSIS — O3680X0 Pregnancy with inconclusive fetal viability, not applicable or unspecified: Secondary | ICD-10-CM

## 2015-04-23 DIAGNOSIS — Z3A01 Less than 8 weeks gestation of pregnancy: Secondary | ICD-10-CM | POA: Insufficient documentation

## 2015-04-23 DIAGNOSIS — N831 Corpus luteum cyst of ovary, unspecified side: Secondary | ICD-10-CM | POA: Insufficient documentation

## 2015-04-23 DIAGNOSIS — R1084 Generalized abdominal pain: Secondary | ICD-10-CM

## 2015-04-23 DIAGNOSIS — R109 Unspecified abdominal pain: Secondary | ICD-10-CM | POA: Insufficient documentation

## 2015-04-23 DIAGNOSIS — Z331 Pregnant state, incidental: Secondary | ICD-10-CM

## 2015-04-23 LAB — WET PREP, GENITAL
CLUE CELLS WET PREP: NONE SEEN
TRICH WET PREP: NONE SEEN
Yeast Wet Prep HPF POC: NONE SEEN

## 2015-04-23 LAB — URINALYSIS, ROUTINE W REFLEX MICROSCOPIC
Bilirubin Urine: NEGATIVE
Glucose, UA: NEGATIVE mg/dL
Hgb urine dipstick: NEGATIVE
Ketones, ur: NEGATIVE mg/dL
Nitrite: NEGATIVE
Protein, ur: NEGATIVE mg/dL
SPECIFIC GRAVITY, URINE: 1.025 (ref 1.005–1.030)
UROBILINOGEN UA: 0.2 mg/dL (ref 0.0–1.0)
pH: 6 (ref 5.0–8.0)

## 2015-04-23 LAB — URINE MICROSCOPIC-ADD ON

## 2015-04-23 LAB — CBC
HCT: 36.3 % (ref 36.0–46.0)
HEMOGLOBIN: 12.3 g/dL (ref 12.0–15.0)
MCH: 28.6 pg (ref 26.0–34.0)
MCHC: 33.9 g/dL (ref 30.0–36.0)
MCV: 84.4 fL (ref 78.0–100.0)
Platelets: 280 10*3/uL (ref 150–400)
RBC: 4.3 MIL/uL (ref 3.87–5.11)
RDW: 12.8 % (ref 11.5–15.5)
WBC: 5.4 10*3/uL (ref 4.0–10.5)

## 2015-04-23 LAB — HCG, QUANTITATIVE, PREGNANCY: HCG, BETA CHAIN, QUANT, S: 1388 m[IU]/mL — AB (ref <5)

## 2015-04-23 LAB — ABO/RH: ABO/RH(D): O POS

## 2015-04-23 LAB — POCT PREGNANCY, URINE: PREG TEST UR: POSITIVE — AB

## 2015-04-23 NOTE — Discharge Instructions (Signed)
Abdominal Pain During Pregnancy °Abdominal pain is common in pregnancy. Most of the time, it does not cause harm. There are many causes of abdominal pain. Some causes are more serious than others. Some of the causes of abdominal pain in pregnancy are easily diagnosed. Occasionally, the diagnosis takes time to understand. Other times, the cause is not determined. Abdominal pain can be a sign that something is very wrong with the pregnancy, or the pain may have nothing to do with the pregnancy at all. For this reason, always tell your health care provider if you have any abdominal discomfort. °HOME CARE INSTRUCTIONS  °Monitor your abdominal pain for any changes. The following actions may help to alleviate any discomfort you are experiencing: °· Do not have sexual intercourse or put anything in your vagina until your symptoms go away completely. °· Get plenty of rest until your pain improves. °· Drink clear fluids if you feel nauseous. Avoid solid food as long as you are uncomfortable or nauseous. °· Only take over-the-counter or prescription medicine as directed by your health care provider. °· Keep all follow-up appointments with your health care provider. °SEEK IMMEDIATE MEDICAL CARE IF: °· You are bleeding, leaking fluid, or passing tissue from the vagina. °· You have increasing pain or cramping. °· You have persistent vomiting. °· You have painful or bloody urination. °· You have a fever. °· You notice a decrease in your baby's movements. °· You have extreme weakness or feel faint. °· You have shortness of breath, with or without abdominal pain. °· You develop a severe headache with abdominal pain. °· You have abnormal vaginal discharge with abdominal pain. °· You have persistent diarrhea. °· You have abdominal pain that continues even after rest, or gets worse. °MAKE SURE YOU:  °· Understand these instructions. °· Will watch your condition. °· Will get help right away if you are not doing well or get worse. °    °This information is not intended to replace advice given to you by your health care provider. Make sure you discuss any questions you have with your health care provider. °  °Document Released: 07/03/2005 Document Revised: 04/23/2013 Document Reviewed: 01/30/2013 °Elsevier Interactive Patient Education ©2016 [employer] °Safe Medications in Pregnancy  ° °Acne: °Benzoyl Peroxide °Salicylic Acid ° °Backache/Headache: °Tylenol: 2 regular strength every 4 hours OR °             2 Extra strength every 6 hours ° °Colds/Coughs/Allergies: °Benadryl (alcohol free) 25 mg every 6 hours as needed °Breath right strips °Claritin °Cepacol throat lozenges °Chloraseptic throat spray °Cold-Eeze- up to three times per day °Cough drops, alcohol free °Flonase (by prescription only) °Guaifenesin °Mucinex °Robitussin DM (plain only, alcohol free) °Saline nasal spray/drops °Sudafed (pseudoephedrine) & Actifed ** use only after [redacted] weeks gestation and if you do not have high blood pressure °Tylenol °Vicks Vaporub °Zinc lozenges °Zyrtec  ° °Constipation: °Colace °Ducolax suppositories °Fleet enema °Glycerin suppositories °Metamucil °Milk of magnesia °Miralax °Senokot °Smooth move tea ° °Diarrhea: °Kaopectate °Imodium A-D ° °*NO pepto Bismol ° °Hemorrhoids: °Anusol °Anusol HC °Preparation H °Tucks ° °Indigestion: °Tums °Maalox °Mylanta °Zantac  °Pepcid ° °Insomnia: °Benadryl (alcohol free) 25mg every 6 hours as needed °Tylenol PM °Unisom, no Gelcaps ° °Leg Cramps: °Tums °MagGel ° °Nausea/Vomiting:  °Bonine °Dramamine °Emetrol °Ginger extract °Sea bands °Meclizine  °Nausea medication to take during pregnancy:  °Unisom (doxylamine succinate 25 mg tablets) Take one tablet daily at bedtime. If symptoms are not adequately controlled, the dose can be increased to   a maximum recommended dose of two tablets daily (1/2 tablet in the morning, 1/2 tablet mid-afternoon and one at bedtime). °Vitamin B6 100mg tablets. Take one tablet twice a day (up to  200 mg per day). ° °Skin Rashes: °Aveeno products °Benadryl cream or 25mg every 6 hours as needed °Calamine Lotion °1% cortisone cream ° °Yeast infection: °Gyne-lotrimin 7 °Monistat 7 ° ° °**If taking multiple medications, please check labels to avoid duplicating the same active ingredients °**take medication as directed on the label °** Do not exceed 4000 mg of tylenol in 24 hours °**Do not take medications that contain aspirin or ibuprofen ° °

## 2015-04-23 NOTE — MAU Provider Note (Signed)
History     CSN: [id number]  Arrival date and time: 04/23/15 4098   First Provider Initiated Contact with Patient 04/23/15 1003         Chief Complaint  Patient presents with  . Vaginal Discharge   HPI  Becky Young is a 24 y.o. female who presents for vaginal irritation.  Vaginal irritation x 3 days. Used new soap while out of town. Can't tell if there's discharge.  No vaginal bleeding.  Sharp abdominal pains, intermittent x 3 days. Currently no pain.  Denies n/v/d/c LMP 9/5    OB History    Gravida Para Term Preterm AB TAB SAB Ectopic Multiple Living   0 1 0 1 0 0 1      Past Medical History  Diagnosis Date  . Eczema   . Bursitis   . Arthritis   . Dysmenorrhea   . Chronic constipation   . Iron deficiency anemia   . Scabies infestation     Past Surgical History  Procedure Laterality Date  . Cesarean section      No family history on file.  Social History  Substance Use Topics  . Smoking status: Never Smoker   . Smokeless tobacco: Not on file  . Alcohol Use: No    Allergies:  Allergies  Allergen Reactions  . Almond Extract Shortness Of Breath and Swelling  . Zofran Hives and Nausea And Vomiting    Pt unsure, but was given medication for nausea and developed hives. She believes it was zofran    Prescriptions prior to admission  Medication Sig Dispense Refill Last Dose  . cyclobenzaprine (FLEXERIL) 10 MG tablet Take 1 tablet (10 mg total) by mouth 3 (three) times daily as needed for muscle spasms. 20 tablet 0   . Dicyclomine HCl (BENTYL PO) Take 1 tablet by mouth as needed (pain).   11/25/2014 at Unknown time  . HYDROcodone-acetaminophen (NORCO/VICODIN) 5-325 MG per tablet Take 1 tablet by mouth daily as needed for severe pain. 4 tablet 0 11/25/2014 at Unknown time  . meloxicam (MOBIC) 15 MG tablet Take 1 tablet (15 mg total) by mouth daily. 20 tablet 0   . predniSONE (DELTASONE) 20 MG tablet Take 3 tablets (60 mg total) by mouth daily. (Patient  not taking: Reported on 11/26/2014) 12 tablet 0   . PRESCRIPTION MEDICATION Apply 1 application topically at bedtime. Medication for eczema.   11/25/2014 at Unknown time    Review of Systems  Constitutional: Negative.   Gastrointestinal: Positive for abdominal pain. Negative for nausea, vomiting, diarrhea and constipation.  Genitourinary: Negative.   Skin: Positive for itching (vaginal).   Physical Exam   Blood pressure 124/70, pulse 93, temperature 99 F (37.2 C), temperature source Oral, resp. rate 16, weight 160 lb 12.8 oz (72.938 kg), last menstrual period 03/22/2015.  Physical Exam  Nursing note and vitals reviewed. Constitutional: She is oriented to person, place, and time. She appears well-developed and well-nourished. No distress.  HENT:  Head: Normocephalic and atraumatic.  Eyes: Conjunctivae are normal. Right eye exhibits no discharge. Left eye exhibits no discharge. No scleral icterus.  Neck: Normal range of motion.  Cardiovascular: Normal rate, regular rhythm and normal heart sounds.   No murmur heard. Respiratory: Effort normal and breath sounds normal. No respiratory distress. She has no wheezes.  GI: Soft. Bowel sounds are normal. She exhibits no distension. There is no tenderness.  Genitourinary: Vagina normal and uterus normal. Cervix exhibits discharge. Cervix exhibits no motion tenderness  and no friability.  Small amount of thin white discharge.  Cervix closed.  No erythema or inflammation noticed on external genitalia.   Neurological: She is alert and oriented to person, place, and time.  Skin: Skin is warm and dry. She is not diaphoretic.  Psychiatric: She has a normal mood and affect. Her behavior is normal. Judgment and thought content normal.    MAU Course  Procedures Results for orders placed or performed during the hospital encounter of 04/23/15 (from the past 24 hour(s))  Urinalysis, Routine w reflex microscopic (not at [facility])     Status: Abnormal    Collection Time: 04/23/15  9:40 AM  Result Value Ref Range   Color, Urine YELLOW YELLOW   APPearance CLEAR CLEAR   Specific Gravity, Urine 1.025 1.005 - 1.030   pH 6.0 5.0 - 8.0   Glucose, UA NEGATIVE NEGATIVE mg/dL   Hgb urine dipstick NEGATIVE NEGATIVE   Bilirubin Urine NEGATIVE NEGATIVE   Ketones, ur NEGATIVE NEGATIVE mg/dL   Protein, ur NEGATIVE NEGATIVE mg/dL   Urobilinogen, UA 0.2 0.0 - 1.0 mg/dL   Nitrite NEGATIVE NEGATIVE   Leukocytes, UA SMALL (A) NEGATIVE  Urine microscopic-add on     Status: None   Collection Time: 04/23/15  9:40 AM  Result Value Ref Range   Squamous Epithelial / LPF RARE RARE   WBC, UA 0-2 <3 WBC/hpf   Urine-Other MUCOUS PRESENT   Pregnancy, urine POC     Status: Abnormal   Collection Time: 04/23/15  9:42 AM  Result Value Ref Range   Preg Test, Ur POSITIVE (A) NEGATIVE  CBC     Status: None   Collection Time: 04/23/15 10:30 AM  Result Value Ref Range   WBC 5.4 4.0 - 10.5 K/uL   RBC 4.30 3.87 - 5.11 MIL/uL   Hemoglobin 12.3 12.0 - 15.0 g/dL   HCT 16.1 09.6 - 04.5 %   MCV 84.4 78.0 - 100.0 fL   MCH 28.6 26.0 - 34.0 pg   MCHC 33.9 30.0 - 36.0 g/dL   RDW 40.9 81.1 - 91.4 %   Platelets 280 150 - 400 K/uL  ABO/Rh     Status: None   Collection Time: 04/23/15 10:30 AM  Result Value Ref Range   ABO/RH(D) O POS   hCG, quantitative, pregnancy     Status: Abnormal   Collection Time: 04/23/15 10:30 AM  Result Value Ref Range   hCG, Beta Chain, Quant, S 1388 (H) <5 mIU/mL  Wet prep, genital     Status: Abnormal   Collection Time: 04/23/15 10:41 AM  Result Value Ref Range   Yeast Wet Prep HPF POC NONE SEEN NONE SEEN   Trich, Wet Prep NONE SEEN NONE SEEN   Clue Cells Wet Prep HPF POC NONE SEEN NONE SEEN   WBC, Wet Prep HPF POC TOO NUMEROUS TO COUNT (A) NONE SEEN   US Ob Comp Less 14 Wks  04/23/2015   CLINICAL DATA:  Vaginal discharge.  Abdominal pain  EXAM: OBSTETRIC <14 WK [place] AND TRANSVAGINAL OB US  TECHNIQUE: Both transabdominal and transvaginal  ultrasound examinations were performed for complete evaluation of the gestation as well as the maternal uterus, adnexal regions, and pelvic cul-de-sac. Transvaginal technique was performed to assess early pregnancy.  COMPARISON:  None.  FINDINGS: Intrauterine gestational sac: Visualized/normal in shape.  Yolk sac:  Not visualized  Embryo:  Not visualized  Cardiac Activity:  Heart Rate:   bpm  MSD: 3.7  mm   5 w  1  d  CRL:    mm    w    d                  [place] EDC:  Maternal uterus/adnexae: No adnexal masses. Left corpus luteum cyst. Trace free fluid in the pelvis.  IMPRESSION: Early intrauterine gestational sac, 5 weeks 1 day by mean sac diameter. No yolk sac or fetal pole currently. This could be followed with repeat ultrasound in 14 days to ensure expected progression.   Electronically Signed   By: Charlett Nose M.D.   On: 04/23/2015 11:49   US Ob Transvaginal  04/23/2015   CLINICAL DATA:  Vaginal discharge.  Abdominal pain  EXAM: OBSTETRIC <14 WK [place] AND TRANSVAGINAL OB US  TECHNIQUE: Both transabdominal and transvaginal ultrasound examinations were performed for complete evaluation of the gestation as well as the maternal uterus, adnexal regions, and pelvic cul-de-sac. Transvaginal technique was performed to assess early pregnancy.  COMPARISON:  None.  FINDINGS: Intrauterine gestational sac: Visualized/normal in shape.  Yolk sac:  Not visualized  Embryo:  Not visualized  Cardiac Activity:  Heart Rate:   bpm  MSD: 3.7  mm   5 w   1  d  CRL:    mm    w    d                  [place] EDC:  Maternal uterus/adnexae: No adnexal masses. Left corpus luteum cyst. Trace free fluid in the pelvis.  IMPRESSION: Early intrauterine gestational sac, 5 weeks 1 day by mean sac diameter. No yolk sac or fetal pole currently. This could be followed with repeat ultrasound in 14 days to ensure expected progression.   Electronically Signed   By: Charlett Nose M.D.   On: 04/23/2015 11:49    MDM UPT positive Probably IUGS, no yolk  sac  Assessment and Plan  A: 1. Abdominal pain in pregnancy   2. Pregnancy of unknown anatomic location    P: Discharge home Ectopic precautions Come to MAU in 48 hrs for [facility] Pregnancy verification letter & list of OB/gyns provided  Judeth Horn, NP  04/23/2015, 9:37 AM

## 2015-04-23 NOTE — MAU Note (Addendum)
Has some type of bacterial infection.  Has a discharge with an itch, noted about 2 days ago. Was on a trip, change in soap

## 2015-04-24 LAB — RPR: RPR Ser Ql: NONREACTIVE

## 2015-04-24 LAB — CULTURE, OB URINE
Culture: >=100000
Special Requests: NORMAL

## 2015-04-24 LAB — HIV ANTIBODY (ROUTINE TESTING W REFLEX): HIV Screen 4th Generation wRfx: NONREACTIVE

## 2015-04-26 LAB — GC/CHLAMYDIA PROBE AMP (~~LOC~~) NOT AT ARMC
CHLAMYDIA, DNA PROBE: NEGATIVE
Neisseria Gonorrhea: NEGATIVE

## 2015-04-30 ENCOUNTER — Inpatient Hospital Stay (HOSPITAL_COMMUNITY)
Admission: AD | Admit: 2015-04-30 | Discharge: 2015-04-30 | Disposition: A | Discharge status: Home / Self Care | Payer: Medicaid Other | Source: Ambulatory Visit | Attending: Obstetrics & Gynecology | Admitting: Obstetrics & Gynecology

## 2015-04-30 DIAGNOSIS — O26891 Other specified pregnancy related conditions, first trimester: Secondary | ICD-10-CM | POA: Insufficient documentation

## 2015-04-30 DIAGNOSIS — R109 Unspecified abdominal pain: Secondary | ICD-10-CM | POA: Insufficient documentation

## 2015-04-30 DIAGNOSIS — O0281 Inappropriate change in quantitative human chorionic gonadotropin (hCG) in early pregnancy: Secondary | ICD-10-CM

## 2015-04-30 DIAGNOSIS — Z3A01 Less than 8 weeks gestation of pregnancy: Secondary | ICD-10-CM | POA: Insufficient documentation

## 2015-04-30 DIAGNOSIS — O3680X0 Pregnancy with inconclusive fetal viability, not applicable or unspecified: Secondary | ICD-10-CM

## 2015-04-30 LAB — HCG, QUANTITATIVE, PREGNANCY: hCG, Beta Chain, Quant, S: 18978 m[IU]/mL — ABNORMAL HIGH (ref <5)

## 2015-04-30 NOTE — MAU Note (Addendum)
Still having random abd pain, and also to get blood level repeated to make sure she is pregn, ? Ectopic. Was to have come on Sunday- could not miss work

## 2015-04-30 NOTE — MAU Provider Note (Signed)
History   [id number]   Chief Complaint  Patient presents with  . Follow-up    HPI Becky Shellngel G Young is a 24 y.o. female G3P1011 here for follow-up BHCG.  Upon review of the records patient was first seen on 10/7 for abdominal pain.   BHCG on that day was 1388.  Ultrasound showed early IUGS, no yolk sac.  GC/CT and wet prep were collected.  Results were negative.   Pt discharged home & was told to return 48 hrs later for BHCG. Pt did not come back for f/u BHCG d/t work. Pt today reports mild cramping that has been unchanged since previous visit. Denies vaginal bleeding.   All other systems negative.    Patient's last menstrual period was 03/22/2015.  OB History  Gravida Para Term Preterm AB SAB TAB Ectopic Multiple Living  3 1 1  0 1 1 0 0 0 1    # Outcome Date GA Lbr Len/2nd Weight Sex Delivery Anes PTL Lv  3 Current           2 SAB           1 Term     M CS-LTranv   Y     Comments: breech      Past Medical History  Diagnosis Date  . Eczema   . Bursitis   . Arthritis   . Dysmenorrhea   . Chronic constipation   . Iron deficiency anemia   . Scabies infestation     No family history on file.  Social History   Social History  . Marital Status: Single    Spouse Name: N/A  . Number of Children: N/A  . Years of Education: N/A   Social History Main Topics  . Smoking status: Never Smoker   . Smokeless tobacco: Not on file  . Alcohol Use: No  . Drug Use: No  . Sexual Activity: Yes    Birth Control/ Protection: None     Comment: depo provera   Other Topics Concern  . Not on file   Social History Narrative    Allergies  Allergen Reactions  . Zofran Hives    Pt unsure, but was given medication for nausea and developed hives. She believes it was zofran    No current facility-administered medications on file prior to encounter.   Current Outpatient Prescriptions on File Prior to Encounter  Medication Sig Dispense Refill  . dicyclomine (BENTYL) 10 MG capsule Take 10  mg by mouth 2 (two) times daily as needed for spasms.    . hydrocerin (EUCERIN) CREA Apply 1 application topically 2 (two) times daily as needed (for exzema).    Marland Kitchen. HYDROcodone-acetaminophen (NORCO/VICODIN) 5-325 MG per tablet Take 1 tablet by mouth daily as needed for severe pain. 4 tablet 0  . PRESCRIPTION MEDICATION Apply 1 application topically at bedtime. Medication for eczema.    . ranitidine (ZANTAC) 150 MG tablet Take 150 mg by mouth 2 (two) times daily as needed for heartburn.    . triamcinolone cream (KENALOG) 0.5 % Apply 1 application topically 3 (three) times daily as needed (for eczema).    . [DISCONTINUED] desogestrel-ethinyl estradiol (APRI,EMOQUETTE,SOLIA) 0.15-30 MG-MCG tablet Take 1 tablet by mouth daily.         Physical Exam   Filed Vitals:   04/30/15 1112  BP: 120/63  Pulse: 87  Temp: 98.5 F (36.9 C)  TempSrc: Oral  Resp: 18    Physical Exam  Constitutional: She is oriented to person, place, and  time. She appears well-developed and well-nourished. No distress.  HENT:  Head: Normocephalic and atraumatic.  Cardiovascular: Normal rate.   Respiratory: Effort normal. No respiratory distress.  GI: Soft. There is no tenderness.  Neurological: She is alert and oriented to person, place, and time.  Skin: She is not diaphoretic.  Psychiatric: She has a normal mood and affect. Her behavior is normal. Judgment and thought content normal.    MAU Course  Procedures Component     Latest Ref Rng 04/23/2015 04/30/2015  HCG, Beta Chain, Quant, S     <5 mIU/mL 1388 (H) 18978 (H)     MDM Appropriate rise in BHCG No change in pain since previous visit; reports as "mild". No vaginal bleeding.  Scheduled to start prenatal care with Dr. Arelia Sneddon next week as self pay.  Will schedule out patient ultrasound next week for viability.   Assessment and Plan  24 y.o. G3P1011 at [redacted]w[redacted]d wks Pregnancy Follow-up BHCG Pregnancy of Unknown Location  P: Discharge home Keep scheduled  prenatal care Outpatient ultrasound for viability Ectopic & SAB precautions discussed  Judeth Horn, NP 04/30/2015 6:07 PM

## 2015-04-30 NOTE — Discharge Instructions (Signed)
First Trimester of Pregnancy The first trimester of pregnancy is from week 1 until the end of week 12 (months 1 through 3). A week after a sperm fertilizes an egg, the egg will implant on the wall of the uterus. This embryo will begin to develop into a baby. Genes from you and your partner are forming the baby. The female genes determine whether the baby is a boy or a girl. At 6-8 weeks, the eyes and face are formed, and the heartbeat can be seen on ultrasound. At the end of 12 weeks, all the baby's organs are formed.  Now that you are pregnant, you will want to do everything you can to have a healthy baby. Two of the most important things are to get good prenatal care and to follow your health care provider's instructions. Prenatal care is all the medical care you receive before the baby's birth. This care will help prevent, find, and treat any problems during the pregnancy and childbirth. BODY CHANGES Your body goes through many changes during pregnancy. The changes vary from woman to woman.   You may gain or lose a couple of pounds at first.  You may feel sick to your stomach (nauseous) and throw up (vomit). If the vomiting is uncontrollable, call your health care provider.  You may tire easily.  You may develop headaches that can be relieved by medicines approved by your health care provider.  You may urinate more often. Painful urination may mean you have a bladder infection.  You may develop heartburn as a result of your pregnancy.  You may develop constipation because certain hormones are causing the muscles that push waste through your intestines to slow down.  You may develop hemorrhoids or swollen, bulging veins (varicose veins).  Your breasts may begin to grow larger and become tender. Your nipples may stick out more, and the tissue that surrounds them (areola) may become darker.  Your gums may bleed and may be sensitive to brushing and flossing.  Dark spots or blotches (chloasma,  mask of pregnancy) may develop on your face. This will likely fade after the baby is born.  Your menstrual periods will stop.  You may have a loss of appetite.  You may develop cravings for certain kinds of food.  You may have changes in your emotions from day to day, such as being excited to be pregnant or being concerned that something may go wrong with the pregnancy and baby.  You may have more vivid and strange dreams.  You may have changes in your hair. These can include thickening of your hair, rapid growth, and changes in texture. Some women also have hair loss during or after pregnancy, or hair that feels dry or thin. Your hair will most likely return to normal after your baby is born. WHAT TO EXPECT AT YOUR PRENATAL VISITS During a routine prenatal visit:  You will be weighed to make sure you and the baby are growing normally.  Your blood pressure will be taken.  Your abdomen will be measured to track your baby's growth.  The fetal heartbeat will be listened to starting around week 10 or 12 of your pregnancy.  Test results from any previous visits will be discussed. Your health care provider may ask you:  How you are feeling.  If you are feeling the baby move.  If you have had any abnormal symptoms, such as leaking fluid, bleeding, severe headaches, or abdominal cramping.  If you are using any tobacco products,   including cigarettes, chewing tobacco, and electronic cigarettes.  If you have any questions. Other tests that may be performed during your first trimester include:  Blood tests to find your blood type and to check for the presence of any previous infections. They will also be used to check for low iron levels (anemia) and Rh antibodies. Later in the pregnancy, blood tests for diabetes will be done along with other tests if problems develop.  Urine tests to check for infections, diabetes, or protein in the urine.  An ultrasound to confirm the proper growth  and development of the baby.  An amniocentesis to check for possible genetic problems.  Fetal screens for spina bifida and Down syndrome.  You may need other tests to make sure you and the baby are doing well.  HIV (human immunodeficiency virus) testing. Routine prenatal testing includes screening for HIV, unless you choose not to have this test. HOME CARE INSTRUCTIONS  Medicines  Follow your health care provider's instructions regarding medicine use. Specific medicines may be either safe or unsafe to take during pregnancy.  Take your prenatal vitamins as directed.  If you develop constipation, try taking a stool softener if your health care provider approves. Diet  Eat regular, well-balanced meals. Choose a variety of foods, such as meat or vegetable-based protein, fish, milk and low-fat dairy products, vegetables, fruits, and whole grain breads and cereals. Your health care provider will help you determine the amount of weight gain that is right for you.  Avoid raw meat and uncooked cheese. These carry germs that can cause birth defects in the baby.  Eating four or five small meals rather than three large meals a day may help relieve nausea and vomiting. If you start to feel nauseous, eating a few soda crackers can be helpful. Drinking liquids between meals instead of during meals also seems to help nausea and vomiting.  If you develop constipation, eat more high-fiber foods, such as fresh vegetables or fruit and whole grains. Drink enough fluids to keep your urine clear or pale yellow. Activity and Exercise  Exercise only as directed by your health care provider. Exercising will help you:  Control your weight.  Stay in shape.  Be prepared for labor and delivery.  Experiencing pain or cramping in the lower abdomen or low back is a good sign that you should stop exercising. Check with your health care provider before continuing normal exercises.  Try to avoid standing for long  periods of time. Move your legs often if you must stand in one place for a long time.  Avoid heavy lifting.  Wear low-heeled shoes, and practice good posture.  You may continue to have sex unless your health care provider directs you otherwise. Relief of Pain or Discomfort  Wear a good support bra for breast tenderness.   Take warm sitz baths to soothe any pain or discomfort caused by hemorrhoids. Use hemorrhoid cream if your health care provider approves.   Rest with your legs elevated if you have leg cramps or low back pain.  If you develop varicose veins in your legs, wear support hose. Elevate your feet for 15 minutes, 3-4 times a day. Limit salt in your diet. Prenatal Care  Schedule your prenatal visits by the twelfth week of pregnancy. They are usually scheduled monthly at first, then more often in the last 2 months before delivery.  Write down your questions. Take them to your prenatal visits.  Keep all your prenatal visits as directed by your   health care provider. Safety  Wear your seat belt at all times when driving.  Make a list of emergency phone numbers, including numbers for family, friends, the hospital, and police and fire departments. General Tips  Ask your health care provider for a referral to a local prenatal education class. Begin classes no later than at the beginning of month 6 of your pregnancy.  Ask for help if you have counseling or nutritional needs during pregnancy. Your health care provider can offer advice or refer you to specialists for help with various needs.  Do not use hot tubs, steam rooms, or saunas.  Do not douche or use tampons or scented sanitary pads.  Do not cross your legs for long periods of time.  Avoid cat litter boxes and soil used by cats. These carry germs that can cause birth defects in the baby and possibly loss of the fetus by miscarriage or stillbirth.  Avoid all smoking, herbs, alcohol, and medicines not prescribed by  your health care provider. Chemicals in these affect the formation and growth of the baby.  Do not use any tobacco products, including cigarettes, chewing tobacco, and electronic cigarettes. If you need help quitting, ask your health care provider. You may receive counseling support and other resources to help you quit.  Schedule a dentist appointment. At home, brush your teeth with a soft toothbrush and be gentle when you floss. SEEK MEDICAL CARE IF:   You have dizziness.  You have mild pelvic cramps, pelvic pressure, or nagging pain in the abdominal area.  You have persistent nausea, vomiting, or diarrhea.  You have a bad smelling vaginal discharge.  You have pain with urination.  You notice increased swelling in your face, hands, legs, or ankles. SEEK IMMEDIATE MEDICAL CARE IF:   You have a fever.  You are leaking fluid from your vagina.  You have spotting or bleeding from your vagina.  You have severe abdominal cramping or pain.  You have rapid weight gain or loss.  You vomit blood or material that looks like coffee grounds.  You are exposed to German measles and have never had them.  You are exposed to fifth disease or chickenpox.  You develop a severe headache.  You have shortness of breath.  You have any kind of trauma, such as from a fall or a car accident.   This information is not intended to replace advice given to you by your health care provider. Make sure you discuss any questions you have with your health care provider.   Document Released: 06/27/2001 Document Revised: 07/24/2014 Document Reviewed: 05/13/2013 Elsevier Interactive Patient Education 2016 [employer]  

## 2015-05-05 ENCOUNTER — Ambulatory Visit (HOSPITAL_COMMUNITY): Payer: Medicaid Other | Attending: Student

## 2015-05-11 ENCOUNTER — Encounter (HOSPITAL_COMMUNITY): Payer: Self-pay | Admitting: *Deleted

## 2015-05-11 ENCOUNTER — Inpatient Hospital Stay (HOSPITAL_COMMUNITY): Payer: Self-pay

## 2015-05-11 ENCOUNTER — Inpatient Hospital Stay (HOSPITAL_COMMUNITY)
Admission: AD | Admit: 2015-05-11 | Discharge: 2015-05-11 | Disposition: A | Discharge status: Home / Self Care | Payer: Self-pay | Source: Ambulatory Visit | Attending: Obstetrics & Gynecology | Admitting: Obstetrics & Gynecology

## 2015-05-11 DIAGNOSIS — O26899 Other specified pregnancy related conditions, unspecified trimester: Secondary | ICD-10-CM

## 2015-05-11 DIAGNOSIS — Z3A01 Less than 8 weeks gestation of pregnancy: Secondary | ICD-10-CM | POA: Insufficient documentation

## 2015-05-11 DIAGNOSIS — O210 Mild hyperemesis gravidarum: Secondary | ICD-10-CM | POA: Insufficient documentation

## 2015-05-11 DIAGNOSIS — O26891 Other specified pregnancy related conditions, first trimester: Secondary | ICD-10-CM | POA: Insufficient documentation

## 2015-05-11 DIAGNOSIS — O219 Vomiting of pregnancy, unspecified: Secondary | ICD-10-CM

## 2015-05-11 DIAGNOSIS — R109 Unspecified abdominal pain: Secondary | ICD-10-CM | POA: Insufficient documentation

## 2015-05-11 LAB — URINE MICROSCOPIC-ADD ON

## 2015-05-11 LAB — URINALYSIS, ROUTINE W REFLEX MICROSCOPIC
Bilirubin Urine: NEGATIVE
Glucose, UA: NEGATIVE mg/dL
KETONES UR: 15 mg/dL — AB
NITRITE: NEGATIVE
PROTEIN: NEGATIVE mg/dL
Specific Gravity, Urine: 1.02 (ref 1.005–1.030)
UROBILINOGEN UA: 0.2 mg/dL (ref 0.0–1.0)
pH: 7 (ref 5.0–8.0)

## 2015-05-11 MED ORDER — PROMETHAZINE HCL 25 MG PO TABS
25.0000 mg | ORAL_TABLET | Freq: Once | ORAL | Status: AC
  Administered 2015-05-11: 25 mg via ORAL
  Filled 2015-05-11: qty 1

## 2015-05-11 MED ORDER — PROMETHAZINE HCL 25 MG PO TABS: 25.0000 mg | ORAL_TABLET | Freq: Four times a day (QID) | ORAL | Status: DC | PRN

## 2015-05-11 NOTE — MAU Note (Signed)
Pt states she continues to have N/V and unable to eat.  Pt states she has abd "spasms at C/Section scar" and doesn't want to eat.  "Something not right".  She states she continues to have nausea all the time.  Denies vaginal bleeding, abnormal or itchy discharge.

## 2015-05-11 NOTE — MAU Provider Note (Signed)
Chief Complaint: Hyperemesis Gravidarum   First Provider Initiated Contact with Patient 05/11/15 1019     SUBJECTIVE HPI: Becky Young is a 24 y.o. G3P1011 at 4128w5d who presents to Maternity Admissions reporting N/V adn continuing moderate low abd cramping. Seen in MAU 10/7 and 10/14 for abd pain. US showed possible GS, but no YS or FP. Wet prep and GC/Chlamydia cultures neg.   Vomiting 0-2 x per day. Improves w/ changes in diet or timing of eating.   Location: low abd Quality: cramping, occasional  Severity: moderate Duration: 3 weeks Context: None Timing: Mild constant pain w/ intermittent exacerbations Modifying factors: None. Hasn't tried anything to Tx pain Associated signs and symptoms: Pos for N/V. Neg for fever, chills, VB, Vaginal discharge, decreased appetite, diarrhea, constipation, urinary complaints.  Past Medical History  Diagnosis Date  . Eczema   . Bursitis   . Arthritis   . Dysmenorrhea   . Chronic constipation   . Iron deficiency anemia   . Scabies infestation    OB History  Gravida Para Term Preterm AB SAB TAB Ectopic Multiple Living  3 1 1  0 1 1 0 0 0 1    # Outcome Date GA Lbr Len/2nd Weight Sex Delivery Anes PTL Lv  3 Current           2 SAB           1 Term     M CS-LTranv   Y     Comments: breech     Past Surgical History  Procedure Laterality Date  . Cesarean section     Social History   Social History  . Marital Status: Single    Spouse Name: N/A  . Number of Children: N/A  . Years of Education: N/A   Occupational History  . Not on file.   Social History Main Topics  . Smoking status: Never Smoker   . Smokeless tobacco: Not on file  . Alcohol Use: No  . Drug Use: No  . Sexual Activity: Yes    Birth Control/ Protection: None     Comment: depo provera   Other Topics Concern  . Not on file   Social History Narrative   No current facility-administered medications on file prior to encounter.   Current Outpatient Prescriptions  on File Prior to Encounter  Medication Sig Dispense Refill  . dicyclomine (BENTYL) 10 MG capsule Take 10 mg by mouth 2 (two) times daily as needed for spasms.    . hydrocerin (EUCERIN) CREA Apply 1 application topically 2 (two) times daily as needed (for exzema).    Marland Kitchen. HYDROcodone-acetaminophen (NORCO/VICODIN) 5-325 MG per tablet Take 1 tablet by mouth daily as needed for severe pain. (Patient taking differently: Take 1 tablet by mouth every 4 (four) hours as needed for severe pain. ) 4 tablet 0  . PRESCRIPTION MEDICATION Apply 1 application topically at bedtime. Medication for eczema.    . ranitidine (ZANTAC) 150 MG tablet Take 150 mg by mouth 2 (two) times daily as needed for heartburn.    . triamcinolone cream (KENALOG) 0.5 % Apply 1 application topically daily as needed (for eczema).     . [DISCONTINUED] desogestrel-ethinyl estradiol (APRI,EMOQUETTE,SOLIA) 0.15-30 MG-MCG tablet Take 1 tablet by mouth daily.       Allergies  Allergen Reactions  . Zofran Hives    I have reviewed the past Medical Hx, Surgical Hx, Social Hx, Allergies and Medications.   Review of Systems  Constitutional: Negative for fever, chills and  appetite change.  Gastrointestinal: Positive for nausea, vomiting and abdominal pain. Negative for diarrhea, constipation and blood in stool.  Genitourinary: Negative for dysuria, urgency, frequency, hematuria, flank pain, vaginal bleeding, vaginal discharge and pelvic pain.  Musculoskeletal: Negative for back pain.    OBJECTIVE Patient Vitals for the past 24 hrs:  BP Temp Temp src Pulse Resp SpO2  05/11/15 0916 110/65 mmHg 98.7 F (37.1 C) Oral 93 18 100 %   Constitutional: Well-developed, well-nourished female in no acute distress.  Cardiovascular: normal rate Respiratory: normal rate and effort.  GI: Abd soft, non-tender, Fundus non-palpable. Pos BS x 4 Neurologic: Alert and oriented x 4.  GU: Neg CVAT.  SPECULUM EXAM: NEFG, physiologic discharge, no blood noted,  cervix clean  BIMANUAL: cervix closed and long; uterus 8-week size, no adnexal tenderness or masses. No CMT.  LAB RESULTS Results for orders placed or performed during the hospital encounter of 05/11/15 (from the past 24 hour(s))  Urinalysis, Routine w reflex microscopic (not at [facility])     Status: Abnormal   Collection Time: 05/11/15  9:05 AM  Result Value Ref Range   Color, Urine YELLOW YELLOW   APPearance CLEAR CLEAR   Specific Gravity, Urine 1.020 1.005 - 1.030   pH 7.0 5.0 - 8.0   Glucose, UA NEGATIVE NEGATIVE mg/dL   Hgb urine dipstick TRACE (A) NEGATIVE   Bilirubin Urine NEGATIVE NEGATIVE   Ketones, ur 15 (A) NEGATIVE mg/dL   Protein, ur NEGATIVE NEGATIVE mg/dL   Urobilinogen, UA 0.2 0.0 - 1.0 mg/dL   Nitrite NEGATIVE NEGATIVE   Leukocytes, UA TRACE (A) NEGATIVE  Urine microscopic-add on     Status: None   Collection Time: 05/11/15  9:05 AM  Result Value Ref Range   Squamous Epithelial / LPF RARE RARE   WBC, UA 0-2 <3 WBC/hpf   Bacteria, UA RARE RARE    IMAGING  US Ob Comp Less 14 Wks  04/23/2015  CLINICAL DATA:  Vaginal discharge.  Abdominal pain EXAM: OBSTETRIC <14 WK [place] AND TRANSVAGINAL OB US TECHNIQUE: Both transabdominal and transvaginal ultrasound examinations were performed for complete evaluation of the gestation as well as the maternal uterus, adnexal regions, and pelvic cul-de-sac. Transvaginal technique was performed to assess early pregnancy. COMPARISON:  None. FINDINGS: Intrauterine gestational sac: Visualized/normal in shape. Yolk sac:  Not visualized Embryo:  Not visualized Cardiac Activity: Heart Rate:   bpm MSD: 3.7  mm   5 w   1  d CRL:    mm    w    d                  [place] EDC: Maternal uterus/adnexae: No adnexal masses. Left corpus luteum cyst. Trace free fluid in the pelvis. IMPRESSION: Early intrauterine gestational sac, 5 weeks 1 day by mean sac diameter. No yolk sac or fetal pole currently. This could be followed with repeat ultrasound in 14 days to ensure  expected progression. Electronically Signed   By: Charlett Nose M.D.   On: 04/23/2015 11:49   US Ob Transvaginal  05/11/2015  CLINICAL DATA:  Abdominal cramping and nausea EXAM: TRANSVAGINAL OB ULTRASOUND TECHNIQUE: Transvaginal ultrasound was performed for complete evaluation of the gestation as well as the maternal uterus, adnexal regions, and pelvic cul-de-sac. COMPARISON:  04/23/2015 FINDINGS: Intrauterine gestational sac: Single Yolk sac:  Yes Embryo:  Yes Cardiac Activity: Yes Heart Rate: 144 bpm CRL:   8.3  mm   6 w 5 d  [place] EDC: 12/30/2015 Maternal uterus/adnexae: Subchorionic hemorrhage: None Right ovary: Normal Left ovary: Normal Other :None Free fluid:  None IMPRESSION: 1. Single living intrauterine gestation. 2. Estimated gestational age is 6 weeks and 5 days. Electronically Signed   By: Signa Kell M.D.   On: 05/11/2015 11:47   US Ob Transvaginal  04/23/2015  CLINICAL DATA:  Vaginal discharge.  Abdominal pain EXAM: OBSTETRIC <14 WK [place] AND TRANSVAGINAL OB US TECHNIQUE: Both transabdominal and transvaginal ultrasound examinations were performed for complete evaluation of the gestation as well as the maternal uterus, adnexal regions, and pelvic cul-de-sac. Transvaginal technique was performed to assess early pregnancy. COMPARISON:  None. FINDINGS: Intrauterine gestational sac: Visualized/normal in shape. Yolk sac:  Not visualized Embryo:  Not visualized Cardiac Activity: Heart Rate:   bpm MSD: 3.7  mm   5 w   1  d CRL:    mm    w    d                  [place] EDC: Maternal uterus/adnexae: No adnexal masses. Left corpus luteum cyst. Trace free fluid in the pelvis. IMPRESSION: Early intrauterine gestational sac, 5 weeks 1 day by mean sac diameter. No yolk sac or fetal pole currently. This could be followed with repeat ultrasound in 14 days to ensure expected progression. Electronically Signed   By: Charlett Nose M.D.   On: 04/23/2015 11:49    MAU COURSE UA, [place], pelvic exam. Declines  repeat GC/Chlamydia cultures, wet prep.  Live IUP  MDM 24 year-old female w/ intermittent low abd pain of uncertain etiology, but confirmed live IUP by [place] today. UA shows trace Hgb and Leuks. No urinary complaints. Will culture. Likely round ligament pain or MS pain from N/V of pregnancy.   ASSESSMENT 1. Abdominal pain affecting pregnancy, antepartum   2. Nausea and vomiting during pregnancy prior to [redacted] weeks gestation    PLAN Discharge home in stable condition. First trimester Precautions Vitamin B6 and Unisom PRN for N/V of pregnancy Rx Phenergen Follow-up Information    Follow up with Obstetrician of choice.   Why:  Start prenatal care      Follow up with THE [facility] [employer] [facility] ADMISSIONS.   Why:  As needed in emergencies   Contact information:   [address] [id number] mc [place] [address] [phone]       Medication List    STOP taking these medications        dicyclomine 10 MG capsule  Commonly known as:  BENTYL     HYDROcodone-acetaminophen 5-325 MG tablet  Commonly known as:  NORCO/VICODIN      TAKE these medications        hydrocerin Crea  Apply 1 application topically 2 (two) times daily as needed (for exzema).     prenatal multivitamin Tabs tablet  Take 2 tablets by mouth daily at 12 noon.     PRESCRIPTION MEDICATION  Apply 1 application topically at bedtime. Medication for eczema.     promethazine 25 MG tablet  Commonly known as:  PHENERGAN  Take 1 tablet (25 mg total) by mouth every 6 (six) hours as needed.     ranitidine 150 MG tablet  Commonly known as:  ZANTAC  Take 150 mg by mouth 2 (two) times daily as needed for heartburn.     triamcinolone cream 0.5 %  Commonly known as:  KENALOG  Apply 1 application topically daily as needed (for eczema).  [place], CNM 05/11/2015  10:20 AM

## 2015-05-11 NOTE — Discharge Instructions (Signed)
Morning Sickness Morning sickness is when you feel sick to your stomach (nauseous) during pregnancy. This nauseous feeling may or may not come with vomiting. It often occurs in the morning but can be a problem any time of day. Morning sickness is most common during the first trimester, but it may continue throughout pregnancy. While morning sickness is unpleasant, it is usually harmless unless you develop severe and continual vomiting (hyperemesis gravidarum). This condition requires more intense treatment.  CAUSES  The cause of morning sickness is not completely known but seems to be related to normal hormonal changes that occur in pregnancy. RISK FACTORS You are at greater risk if you:  Experienced nausea or vomiting before your pregnancy.  Had morning sickness during a previous pregnancy.  Are pregnant with more than one baby, such as twins. TREATMENT  Do not use any medicines (prescription, over-the-counter, or herbal) for morning sickness without first talking to your health care provider. Your health care provider may prescribe or recommend:  Vitamin B6 supplements.  Anti-nausea medicines.  The herbal medicine ginger. HOME CARE INSTRUCTIONS   Only take over-the-counter or prescription medicines as directed by your health care provider.  Taking multivitamins before getting pregnant can prevent or decrease the severity of morning sickness in most women.  Eat a piece of dry toast or unsalted crackers before getting out of bed in the morning.  Eat five or six small meals a day.  Eat dry and bland foods (rice, baked potato). Foods high in carbohydrates are often helpful.  Do not drink liquids with your meals. Drink liquids between meals.  Avoid greasy, fatty, and spicy foods.  Get someone to cook for you if the smell of any food causes nausea and vomiting.  If you feel nauseous after taking prenatal vitamins, take the vitamins at night or with a snack.  Snack on protein  foods (nuts, yogurt, cheese) between meals if you are hungry.  Eat unsweetened gelatins for desserts.  Wearing an acupressure wristband (worn for sea sickness) may be helpful.  Acupuncture may be helpful.  Do not smoke.  Get a humidifier to keep the air in your house free of odors.  Get plenty of fresh air. SEEK MEDICAL CARE IF:   Your home remedies are not working, and you need medicine.  You feel dizzy or lightheaded.  You are losing weight. SEE K IMMEDIATE MEDICAL CARE IF:   You have persistent and uncontrolled nausea and vomiting.  You [place] out (faint). MAKE SURE YOU:  Understand these instructions.  Will watch your condition.  Will get help right away if you are not doing well or get worse.   This information is not intended to replace advice given to you by your health care provider. Make sure you discuss any questions you have with your health care provider.   Document Released: 08/24/2006 Document Revised: 07/08/2013 Document Reviewed: 12/18/2012 Elsevier Interactive Patient Education 2016 [employer]  Eating Plan for morning sickness This eating plan is one way to lessen the symptoms of nausea and vomiting. It is often used with prescribed medicines to control your symptoms.  WHAT CAN I DO TO RELIEVE MY SYMPTOMS? Listen to your body. Everyone is different and has different preferences. Find what works best for you. Some of the following things may help:  Eat and drink slowly.  Eat 5-6 small meals daily instead of 3 large meals.   Eat crackers before you get out of bed in the morning.   Starchy foods are usually  well tolerated (such as cereal, toast, bread, potatoes, pasta, rice, and pretzels).   Ginger may help with nausea. Add  tsp ground ginger to hot tea or choose ginger tea.   Try drinking 100% fruit juice or an electrolyte drink.  Continue to take your prenatal vitamins as directed by your health care provider. If you are having trouble  taking your prenatal vitamins, talk with your health care provider about different options.  Include at least 1 serving of protein with your meals and snacks (such as meats or poultry, beans, nuts, eggs, or yogurt). Try eating a protein-rich snack before bed (such as cheese and crackers or a half [place] or peanut butter sandwich). WHAT THINGS SHOULD I AVOID TO REDUCE MY SYMPTOMS? The following things may help reduce your symptoms:  Avoid foods with strong smells. Try eating meals in well-ventilated areas that are free of odors.  Avoid drinking water or other beverages with meals. Try not to drink anything less than 30 minutes before and after meals.  Avoid drinking more than 1 cup of fluid at a time.  Avoid fried or high-fat foods, such as butter and cream sauces.  Avoid spicy foods.  Avoid skipping meals the best you can. Nausea can be more intense on an empty stomach. If you cannot tolerate food at that time, do not force it. Try sucking on ice chips or other frozen items and make up the calories later.  Avoid lying down within 2 hours after eating.   This information is not intended to replace advice given to you by your health care provider. Make sure you discuss any questions you have with your health care provider.   Document Released: 04/30/2007 Document Revised: 07/08/2013 Document Reviewed: 05/07/2013 Elsevier Interactive Patient Education [employer]

## 2015-07-03 ENCOUNTER — Inpatient Hospital Stay (HOSPITAL_COMMUNITY)
Admission: AD | Admit: 2015-07-03 | Discharge: 2015-07-04 | Disposition: A | Discharge status: Home / Self Care | Payer: Self-pay | Source: Ambulatory Visit | Attending: Obstetrics & Gynecology | Admitting: Obstetrics & Gynecology

## 2015-07-03 ENCOUNTER — Encounter (HOSPITAL_COMMUNITY): Payer: Self-pay | Admitting: *Deleted

## 2015-07-03 DIAGNOSIS — O26899 Other specified pregnancy related conditions, unspecified trimester: Secondary | ICD-10-CM

## 2015-07-03 DIAGNOSIS — O26892 Other specified pregnancy related conditions, second trimester: Secondary | ICD-10-CM | POA: Insufficient documentation

## 2015-07-03 DIAGNOSIS — O210 Mild hyperemesis gravidarum: Secondary | ICD-10-CM | POA: Insufficient documentation

## 2015-07-03 DIAGNOSIS — R51 Headache: Secondary | ICD-10-CM | POA: Insufficient documentation

## 2015-07-03 DIAGNOSIS — Z3A14 14 weeks gestation of pregnancy: Secondary | ICD-10-CM | POA: Insufficient documentation

## 2015-07-03 DIAGNOSIS — R102 Pelvic and perineal pain: Secondary | ICD-10-CM | POA: Insufficient documentation

## 2015-07-03 LAB — URINALYSIS, ROUTINE W REFLEX MICROSCOPIC
Bilirubin Urine: NEGATIVE
GLUCOSE, UA: NEGATIVE mg/dL
Hgb urine dipstick: NEGATIVE
KETONES UR: NEGATIVE mg/dL
Nitrite: NEGATIVE
PH: 7 (ref 5.0–8.0)
Protein, ur: NEGATIVE mg/dL
SPECIFIC GRAVITY, URINE: 1.01 (ref 1.005–1.030)

## 2015-07-03 LAB — URINE MICROSCOPIC-ADD ON

## 2015-07-03 MED ORDER — OXYCODONE-ACETAMINOPHEN 5-325 MG PO TABS
2.0000 | ORAL_TABLET | Freq: Once | ORAL | Status: AC
  Administered 2015-07-04: 2 via ORAL
  Filled 2015-07-03: qty 2

## 2015-07-03 NOTE — MAU Provider Note (Signed)
History     CSN: [id number]  Arrival date and time: 07/03/15 2252   None     No chief complaint on file.  HPI  24 y.o. G3P1011 at [redacted]w[redacted]d w/ complaint of headaches and "contractions". Planning prenatal care at [facility], appt 07/23/15.  - Headache x 3 days, h/o migraines in pregnancy, no relief w/ Tylenol, hasn't taken any Tylenol today. Pain moves from back of head at neck to right eye, no visual disturbance.  - "Contractions" x 3 days - sharp vaginal pain a few times/day, no pelvic or abd pain. Denies vaginal bleeding or discharge.     Past Medical History  Diagnosis Date  . Eczema   . Bursitis   . Arthritis   . Dysmenorrhea   . Chronic constipation   . Iron deficiency anemia   . Scabies infestation     Past Surgical History  Procedure Laterality Date  . Cesarean section      No family history on file.  Social History  Substance Use Topics  . Smoking status: Never Smoker   . Smokeless tobacco: Not on file  . Alcohol Use: No    Allergies:  Allergies  Allergen Reactions  . Zofran Hives    Prescriptions prior to admission  Medication Sig Dispense Refill Last Dose  . hydrocerin (EUCERIN) CREA Apply 1 application topically 2 (two) times daily as needed (for exzema).   Past Week at Unknown time  . Prenatal Vit-Fe Fumarate-FA (PRENATAL MULTIVITAMIN) TABS tablet Take 2 tablets by mouth daily at 12 noon.   Past Week at Unknown time  . PRESCRIPTION MEDICATION Apply 1 application topically at bedtime. Medication for eczema.   Past Week at Unknown time  . promethazine (PHENERGAN) 25 MG tablet Take 1 tablet (25 mg total) by mouth every 6 (six) hours as needed. 30 tablet 1   . ranitidine (ZANTAC) 150 MG tablet Take 150 mg by mouth 2 (two) times daily as needed for heartburn.   05/10/2015 at Unknown time  . triamcinolone cream (KENALOG) 0.5 % Apply 1 application topically daily as needed (for eczema).    Past Week at Unknown time    Review of Systems  Constitutional:  Negative.   Eyes: Negative for blurred vision and photophobia.  Respiratory: Negative.   Cardiovascular: Negative.   Gastrointestinal: Negative for nausea, vomiting, abdominal pain, diarrhea and constipation.  Genitourinary: Negative for dysuria, urgency, frequency, hematuria and flank pain.       Negative for vaginal bleeding, vaginal discharge, dyspareunia  Musculoskeletal: Negative.   Neurological: Positive for headaches.  Psychiatric/Behavioral: Negative.    Physical Exam   Last menstrual period 03/22/2015.  Physical Exam  Nursing note and vitals reviewed. Constitutional: She is oriented to person, place, and time. She appears well-developed and well-nourished. No distress.  Cardiovascular: Normal rate.   Respiratory: Effort normal.  GI: Soft. She exhibits no distension and no mass. There is no tenderness. There is no rebound and no guarding.  Genitourinary:  Cervix long and closed   Musculoskeletal: Normal range of motion.  Neurological: She is alert and oriented to person, place, and time.  Skin: Skin is warm and dry.  Psychiatric: She has a normal mood and affect.    MAU Course  Procedures  Results for orders placed or performed during the hospital encounter of 07/03/15 (from the past 24 hour(s))  Urinalysis, Routine w reflex microscopic (not at [facility])     Status: Abnormal   Collection Time: 07/03/15 11:00 PM  Result Value Ref  Range   Color, Urine YELLOW YELLOW   APPearance CLEAR CLEAR   Specific Gravity, Urine 1.010 1.005 - 1.030   pH 7.0 5.0 - 8.0   Glucose, UA NEGATIVE NEGATIVE mg/dL   Hgb urine dipstick NEGATIVE NEGATIVE   Bilirubin Urine NEGATIVE NEGATIVE   Ketones, ur NEGATIVE NEGATIVE mg/dL   Protein, ur NEGATIVE NEGATIVE mg/dL   Nitrite NEGATIVE NEGATIVE   Leukocytes, UA SMALL (A) NEGATIVE  Urine microscopic-add on     Status: Abnormal   Collection Time: 07/03/15 11:00 PM  Result Value Ref Range   Squamous Epithelial / LPF 0-5 (A) NONE SEEN   WBC, UA  0-5 0 - 5 WBC/hpf   RBC / HPF 0-5 0 - 5 RBC/hpf   Bacteria, UA RARE (A) NONE SEEN   Urine-Other AMORPHOUS URATES/PHOSPHATES    Some improvement in headache w/ Percocet. Patient ready for discharge. Also requests refill on Phenergan for nausea.   Assessment and Plan   1. Headache in pregnancy, antepartum, second trimester   2. Pain of round ligament affecting pregnancy, antepartum   3. N/V in pregnancy Phenergan refilled for nausea, Fioricet rx for headache, precautions rev'd, f/u as scheduled w/ outside provider    Medication List    TAKE these medications        butalbital-acetaminophen-caffeine 50-325-40 MG tablet  Commonly known as:  FIORICET  Take 1-2 tablets by mouth every 6 (six) hours as needed for headache.     hydrocerin Crea  Apply 1 application topically 2 (two) times daily as needed (for exzema).     prenatal multivitamin Tabs tablet  Take 2 tablets by mouth daily at 12 noon.     PRESCRIPTION MEDICATION  Apply 1 application topically at bedtime. Medication for eczema.     promethazine 25 MG tablet  Commonly known as:  PHENERGAN  Take 1 tablet (25 mg total) by mouth every 6 (six) hours as needed.     ranitidine 150 MG tablet  Commonly known as:  ZANTAC  Take 150 mg by mouth 2 (two) times daily as needed for heartburn.     triamcinolone cream 0.5 %  Commonly known as:  KENALOG  Apply 1 application topically daily as needed (for eczema).            Follow-up Information    Follow up with your provider.   Why:  as scheduled or sooner as needed        Cyra Spader 07/03/2015, 10:58 PM

## 2015-07-03 NOTE — MAU Note (Signed)
Pt reports she is having contractions for the past 3 days. Denies any vag bleeding or leaking. Pt also having a migraine for several days as well.  Tylenol has not helped.

## 2015-07-04 DIAGNOSIS — O26892 Other specified pregnancy related conditions, second trimester: Secondary | ICD-10-CM

## 2015-07-04 DIAGNOSIS — R51 Headache: Secondary | ICD-10-CM

## 2015-07-04 MED ORDER — BUTALBITAL-APAP-CAFFEINE 50-325-40 MG PO TABS: 1.0000 | ORAL_TABLET | Freq: Four times a day (QID) | ORAL | Status: DC | PRN

## 2015-07-04 MED ORDER — PROMETHAZINE HCL 25 MG PO TABS: 25.0000 mg | ORAL_TABLET | Freq: Four times a day (QID) | ORAL | Status: DC | PRN

## 2015-11-30 LAB — OB RESULTS CONSOLE GBS: GBS: NEGATIVE

## 2015-12-03 ENCOUNTER — Encounter (HOSPITAL_COMMUNITY): Payer: Self-pay

## 2015-12-03 ENCOUNTER — Inpatient Hospital Stay (HOSPITAL_COMMUNITY)
Admission: AD | Admit: 2015-12-03 | Discharge: 2015-12-03 | Disposition: A | Discharge status: Home / Self Care | Payer: Self-pay | Source: Ambulatory Visit | Attending: Family Medicine | Admitting: Family Medicine

## 2015-12-03 DIAGNOSIS — Z3A36 36 weeks gestation of pregnancy: Secondary | ICD-10-CM | POA: Insufficient documentation

## 2015-12-03 DIAGNOSIS — O1203 Gestational edema, third trimester: Secondary | ICD-10-CM

## 2015-12-03 LAB — URINALYSIS, ROUTINE W REFLEX MICROSCOPIC
BILIRUBIN URINE: NEGATIVE
GLUCOSE, UA: NEGATIVE mg/dL
Hgb urine dipstick: NEGATIVE
KETONES UR: NEGATIVE mg/dL
Leukocytes, UA: NEGATIVE
Nitrite: NEGATIVE
PH: 6.5 (ref 5.0–8.0)
Protein, ur: NEGATIVE mg/dL
Specific Gravity, Urine: 1.01 (ref 1.005–1.030)

## 2015-12-03 NOTE — MAU Provider Note (Signed)
History     CSN: [id number]  Arrival date and time: 12/03/15 1129   None     Chief Complaint  Patient presents with  . Leg Swelling  . Facial Swelling   HPI [name] is a 25yo G3P1011 @ 36.1wks who presents for eval of waking up this morning w/ severe swelling in BLE and also in arms/hands and face. States her eyes were almost swollen shut, but that spontaneously resolved after being awake for a time. Her swelling has been gradually increasing and at her last OB visit had gained 5lbs in 4 days; BP was a little higher than normal, but still in the normal range. Today she denies ctx, H/A, blurred vision, RUQ pain or any other concerns. States she occ sees spots. She sees an OB provider in [place] on [address] and states her preg has been essentially normal.  OB History    Gravida Para Term Preterm AB TAB SAB Ectopic Multiple Living   3 1 1  0 1 0 1 0 0 1      Past Medical History  Diagnosis Date  . Eczema   . Bursitis   . Arthritis   . Dysmenorrhea   . Chronic constipation   . Iron deficiency anemia   . Scabies infestation     Past Surgical History  Procedure Laterality Date  . Cesarean section      Family History  Problem Relation Age of Onset  . Hypertension Mother   . Cancer Maternal Grandmother     Social History  Substance Use Topics  . Smoking status: Never Smoker   . Smokeless tobacco: None  . Alcohol Use: No    Allergies:  Allergies  Allergen Reactions  . Zofran Hives    Prescriptions prior to admission  Medication Sig Dispense Refill Last Dose  . hydrocerin (EUCERIN) CREA Apply 1 application topically 2 (two) times daily as needed (for exzema).   Past Week at Unknown time  . promethazine (PHENERGAN) 25 MG tablet Take 1 tablet (25 mg total) by mouth every 6 (six) hours as needed. (Patient taking differently: Take 25 mg by mouth every 6 (six) hours as needed for nausea or vomiting. ) 30 tablet 1 Past Week at Unknown time  . ranitidine (ZANTAC) 150 MG  tablet Take 150 mg by mouth 2 (two) times daily as needed for heartburn.   Past Week at Unknown time  . triamcinolone cream (KENALOG) 0.5 % Apply 1 application topically daily as needed (for eczema).    Past Week at Unknown time  . butalbital-acetaminophen-caffeine (FIORICET) 50-325-40 MG tablet Take 1-2 tablets by mouth every 6 (six) hours as needed for headache. (Patient not taking: Reported on 12/03/2015) 20 tablet 0 Not Taking at Unknown time    ROS No other pertinents other than what is listed in HPI. Physical Exam   Blood pressure 129/73, pulse 93, temperature 98.5 F (36.9 C), temperature source Oral, resp. rate 18, height 5\' 3"  (1.6 m), weight 94.892 kg (209 lb 3.2 oz), last menstrual period 03/22/2015.  Physical Exam  Constitutional: She is oriented to person, place, and time. She appears well-developed.  HENT:  Head: Normocephalic.  Neck: Normal range of motion.  Cardiovascular: Normal rate.   Respiratory: Effort normal.  GI:  EFM 140s, +accels, no decels Rare ctx  Genitourinary:  deferred  Musculoskeletal: Normal range of motion.  Neurological: She is alert and oriented to person, place, and time.  Skin: Skin is warm and dry.  Psychiatric: She has a  normal mood and affect. Her behavior is normal. Thought content normal.   Urinalysis    Component Value Date/Time   COLORURINE YELLOW 12/03/2015 1135   APPEARANCEUR CLEAR 12/03/2015 1135   LABSPEC 1.010 12/03/2015 1135   PHURINE 6.5 12/03/2015 1135   GLUCOSEU NEGATIVE 12/03/2015 1135   HGBUR NEGATIVE 12/03/2015 1135   BILIRUBINUR NEGATIVE 12/03/2015 1135   KETONESUR NEGATIVE 12/03/2015 1135   PROTEINUR NEGATIVE 12/03/2015 1135   UROBILINOGEN 0.2 05/11/2015 0905   NITRITE NEGATIVE 12/03/2015 1135   LEUKOCYTESUR NEGATIVE 12/03/2015 1135     MAU Course  Procedures  MDM UA ordered NST  Assessment and Plan  IUP@36 .1wks Edema of pregnancy without elevated BP/proteinuria  D/C home  Measures rev'd such as  drinking 80-100oz of water qd, elevating legs above the heart Pre-e warning signs rev'd Keep next scheduled OB visit  Cam Hai CNM 12/03/2015, 12:39 PM

## 2015-12-03 NOTE — MAU Note (Signed)
Patient [facility], had 5 pound weight gain over the past 2 weeks, concerned because waking up with facial and lower extremity swelling.

## 2015-12-03 NOTE — MAU Note (Signed)
Urine sent to lab 

## 2015-12-03 NOTE — Discharge Instructions (Signed)

## 2015-12-03 NOTE — MAU Note (Signed)
Has had more swelling than usual, face, hands, legs.  Face swelling x 1 week. Weight has been stable this week.  BP has been elevated.

## 2016-02-26 ENCOUNTER — Encounter (HOSPITAL_COMMUNITY): Payer: Self-pay

## 2017-10-02 ENCOUNTER — Inpatient Hospital Stay (EMERGENCY_DEPARTMENT_HOSPITAL)
Admission: AD | Admit: 2017-10-02 | Discharge: 2017-10-03 | Disposition: A | Discharge status: Home / Self Care | Payer: Medicaid Other | Source: Ambulatory Visit | Admitting: Family Medicine

## 2017-10-02 ENCOUNTER — Encounter (HOSPITAL_COMMUNITY): Payer: Self-pay

## 2017-10-02 ENCOUNTER — Inpatient Hospital Stay (HOSPITAL_COMMUNITY)
Admission: AD | Admit: 2017-10-02 | Discharge: 2017-10-02 | Discharge status: Against Medical Advice | Payer: Medicaid Other | Source: Ambulatory Visit | Attending: Family Medicine | Admitting: Family Medicine

## 2017-10-02 DIAGNOSIS — R102 Pelvic and perineal pain: Secondary | ICD-10-CM | POA: Insufficient documentation

## 2017-10-02 DIAGNOSIS — K5909 Other constipation: Secondary | ICD-10-CM | POA: Insufficient documentation

## 2017-10-02 DIAGNOSIS — Z5321 Procedure and treatment not carried out due to patient leaving prior to being seen by health care provider: Secondary | ICD-10-CM | POA: Diagnosis not present

## 2017-10-02 DIAGNOSIS — Z888 Allergy status to other drugs, medicaments and biological substances status: Secondary | ICD-10-CM | POA: Insufficient documentation

## 2017-10-02 DIAGNOSIS — Z79899 Other long term (current) drug therapy: Secondary | ICD-10-CM | POA: Insufficient documentation

## 2017-10-02 DIAGNOSIS — G8929 Other chronic pain: Secondary | ICD-10-CM

## 2017-10-02 DIAGNOSIS — N898 Other specified noninflammatory disorders of vagina: Secondary | ICD-10-CM

## 2017-10-02 LAB — URINALYSIS, ROUTINE W REFLEX MICROSCOPIC
Bacteria, UA: NONE SEEN
Bilirubin Urine: NEGATIVE
Glucose, UA: NEGATIVE mg/dL
Ketones, ur: NEGATIVE mg/dL
Nitrite: NEGATIVE
Protein, ur: NEGATIVE mg/dL
Specific Gravity, Urine: 1.027 (ref 1.005–1.030)
pH: 6 (ref 5.0–8.0)

## 2017-10-02 LAB — POCT PREGNANCY, URINE: Preg Test, Ur: NEGATIVE

## 2017-10-02 MED ORDER — KETOROLAC TROMETHAMINE 30 MG/ML IJ SOLN
30.0000 mg | Freq: Once | INTRAMUSCULAR | Status: AC
  Administered 2017-10-02: 30 mg via INTRAMUSCULAR
  Filled 2017-10-02: qty 1

## 2017-10-02 NOTE — Discharge Instructions (Signed)
Pelvic Pain, Female °Pelvic pain is pain in your lower belly (abdomen), below your belly button and between your hips. The pain may start suddenly (acute), keep coming back (recurring), or last a long time (chronic). Pelvic pain that lasts longer than six months is considered chronic. There are many causes of pelvic pain. Sometimes the cause of your pelvic pain is not known. °Follow these instructions at home: °· Take over-the-counter and prescription medicines only as told by your doctor. °· Rest as told by your doctor. °· Do not have sex it if hurts. °· Keep a journal of your pelvic pain. Write down: °? When the pain started. °? Where the pain is located. °? What seems to make the pain better or worse, such as food or your menstrual cycle. °? Any symptoms you have along with the pain. °· Keep all follow-up visits as told by your doctor. This is important. °Contact a doctor if: °· Medicine does not help your pain. °· Your pain comes back. °· You have new symptoms. °· You have unusual vaginal discharge or bleeding. °· You have a fever or chills. °· You are having a hard time pooping (constipation). °· You have blood in your pee (urine) or poop (stool). °· Your pee smells bad. °· You feel weak or lightheaded. °Get help right away if: °· You have sudden pain that is very bad. °· Your pain continues to get worse. °· You have very bad pain and also have any of the following symptoms: °? A fever. °? Feeling stick to your stomach (nausea). °? Throwing up (vomiting). °? Being very sweaty. °· You [place] out (lose consciousness). °This information is not intended to replace advice given to you by your health care provider. Make sure you discuss any questions you have with your health care provider. °Document Released: 12/20/2007 Document Revised: 07/28/2015 Document Reviewed: 04/23/2015 °Elsevier Interactive Patient Education © 2018 Elsevier Inc. ° °

## 2017-10-02 NOTE — MAU Note (Signed)
Pt presents with complaint of brownish discharge and pelvic pain.

## 2017-10-02 NOTE — MAU Note (Signed)
Pt told admissions that she needed to leave to get her kids and left.

## 2017-10-02 NOTE — MAU Note (Signed)
Intermittent left lower abdominal pain.  States she has had this pain before-was determined to be a ruptured cyst.  Hx of dysmenorrhea since being in the [employer].  States she has this pain approximately 4 times a day.  Has taken tylenol for the pain without relief-last took 650 mg at 1100.  Also having brown spotting off and on since her last period.   LMP 09/10/17.

## 2017-10-02 NOTE — MAU Provider Note (Signed)
History     CSN: [id number]  Arrival date and time: 10/02/17 2232   None     Chief Complaint  Patient presents with  . Pelvic Pain   HPI Becky Young is a 27 y.o. G3P2011 who presents to the MAU complaining of pelvic pain. Patient states that she has had this pelvic pain for years. It is sometimes hard for her to distinguish between her "intestional pain". The pain is always located on the left side. Has had abdominal surgery before and pain feels to be located around incision. Has been unable to control the pain at home with Tylenol. She has been told she may have endometriosis but has no official diagnosis. Has been diagnosed with dysmenorrhea. Pain comes and goes. Has some associated brown discharge that has been lingering that she is concerned about along with pelvic pain. Denies odor. Denies fevers, n/v. Not on birth control and has been with the same partner for 7 years.   Past Medical History:  Diagnosis Date  . Arthritis   . Bursitis   . Chronic constipation   . Dysmenorrhea   . Eczema   . Iron deficiency anemia   . Scabies infestation     Past Surgical History:  Procedure Laterality Date  . CESAREAN SECTION      Family History  Problem Relation Age of Onset  . Hypertension Mother   . Cancer Maternal Grandmother     Social History   Tobacco Use  . Smoking status: Never Smoker  Substance Use Topics  . Alcohol use: No  . Drug use: No    Allergies:  Allergies  Allergen Reactions  . Almond Meal   . Zofran Hives    Medications Prior to Admission  Medication Sig Dispense Refill Last Dose  . dicyclomine (BENTYL) 20 MG tablet Take 20 mg by mouth 2 (two) times daily.     . hydrocerin (EUCERIN) CREA Apply 1 application topically 2 (two) times daily as needed (for exzema).   Past Week at Unknown time  . ranitidine (ZANTAC) 150 MG tablet Take 150 mg by mouth 2 (two) times daily as needed for heartburn.   Past Week at Unknown time  . triamcinolone cream  (KENALOG) 0.5 % Apply 1 application topically daily as needed (for eczema).    Past Week at Unknown time    Review of Systems  All systems reviewed and are negative for acute change except as noted in the HPI.  Physical Exam   Blood pressure 117/66, pulse 90, temperature 99.7 F (37.6 C), temperature source Oral, resp. rate 19, height 5\' 3"  (1.6 m), weight 68.5 kg (151 lb), last menstrual period 09/10/2017, SpO2 99 %, unknown if currently breastfeeding.  Physical Exam  Nursing note and vitals reviewed. Constitutional: She is oriented to person, place, and time. She appears well-developed and well-nourished. No distress.  HENT:  Head: Normocephalic and atraumatic.  Eyes: Conjunctivae and EOM are normal.  Neck: Normal range of motion. Neck supple.  Cardiovascular: Normal rate and regular rhythm.  Respiratory: Effort normal.  GI: Soft. Bowel sounds are normal. She exhibits no distension. There is tenderness in the left lower quadrant. There is no guarding and no CVA tenderness.  Genitourinary: Vagina normal and uterus normal. Cervix exhibits no motion tenderness and no friability. Right adnexum displays no tenderness and no fullness. Left adnexum displays no tenderness and no fullness. No vaginal discharge found.  Musculoskeletal: Normal range of motion.  Neurological: She is alert and oriented to person, place,  and time.  Psychiatric: She has a normal mood and affect.   Results for orders placed or performed during the hospital encounter of 10/02/17  Wet prep, genital  Result Value Ref Range   Yeast Wet Prep HPF POC NONE SEEN NONE SEEN   Trich, Wet Prep NONE SEEN NONE SEEN   Clue Cells Wet Prep HPF POC NONE SEEN NONE SEEN   WBC, Wet Prep HPF POC FEW (A) NONE SEEN   Sperm NONE SEEN   Urinalysis, Routine w reflex microscopic  Result Value Ref Range   Color, Urine YELLOW YELLOW   APPearance HAZY (A) CLEAR   Specific Gravity, Urine 1.027 1.005 - 1.030   pH 6.0 5.0 - 8.0   Glucose,  UA NEGATIVE NEGATIVE mg/dL   Hgb urine dipstick MODERATE (A) NEGATIVE   Bilirubin Urine NEGATIVE NEGATIVE   Ketones, ur NEGATIVE NEGATIVE mg/dL   Protein, ur NEGATIVE NEGATIVE mg/dL   Nitrite NEGATIVE NEGATIVE   Leukocytes, UA SMALL (A) NEGATIVE   RBC / HPF 0-5 0 - 5 RBC/hpf   WBC, UA 0-5 0 - 5 WBC/hpf   Bacteria, UA NONE SEEN NONE SEEN   Squamous Epithelial / LPF 6-30 (A) NONE SEEN   Mucus PRESENT   Pregnancy, urine POC  Result Value Ref Range   Preg Test, Ur NEGATIVE NEGATIVE  GC/Chlamydia probe amp ([place])not at [facility]  Result Value Ref Range   Chlamydia Negative    Neisseria gonorrhea Negative      MAU Course  Procedures  MDM Upreg negative Wet prep negative UA unremarkable Cultures collected Pelvic exam benign - no discharge or pain with bimanual exam Treatments in MAU: Tramadol  Assessment and Plan  Chronic pelvic pain in female  Vaginal discharge  Patient to follow-up with GYN for pelvic pain work-up; no red flags Discharge ins table condition Rx for OCPs  Conservative measures discussed.   Caryl Ada, DO 10/02/2017, 11:27 PM

## 2017-10-03 DIAGNOSIS — N898 Other specified noninflammatory disorders of vagina: Secondary | ICD-10-CM

## 2017-10-03 DIAGNOSIS — R102 Pelvic and perineal pain: Secondary | ICD-10-CM

## 2017-10-03 DIAGNOSIS — G8929 Other chronic pain: Secondary | ICD-10-CM

## 2017-10-03 LAB — WET PREP, GENITAL
Clue Cells Wet Prep HPF POC: NONE SEEN
Sperm: NONE SEEN
TRICH WET PREP: NONE SEEN
Yeast Wet Prep HPF POC: NONE SEEN

## 2017-10-03 LAB — GC/CHLAMYDIA PROBE AMP (~~LOC~~) NOT AT ARMC
CHLAMYDIA, DNA PROBE: NEGATIVE
NEISSERIA GONORRHEA: NEGATIVE

## 2017-10-03 MED ORDER — NORGESTIMATE-ETH ESTRADIOL 0.25-35 MG-MCG PO TABS: 1.0000 | ORAL_TABLET | Freq: Every day | ORAL | 11 refills | Status: DC

## 2018-08-08 ENCOUNTER — Encounter (HOSPITAL_COMMUNITY): Payer: Self-pay | Admitting: *Deleted

## 2018-08-08 ENCOUNTER — Other Ambulatory Visit: Payer: Self-pay

## 2018-08-08 ENCOUNTER — Inpatient Hospital Stay (HOSPITAL_COMMUNITY)
Admission: AD | Admit: 2018-08-08 | Discharge: 2018-08-08 | Disposition: A | Discharge status: Home / Self Care | Payer: Medicaid Other | Attending: Obstetrics and Gynecology | Admitting: Obstetrics and Gynecology

## 2018-08-08 DIAGNOSIS — Z3202 Encounter for pregnancy test, result negative: Secondary | ICD-10-CM | POA: Diagnosis not present

## 2018-08-08 DIAGNOSIS — R102 Pelvic and perineal pain: Secondary | ICD-10-CM

## 2018-08-08 LAB — CBC
HEMATOCRIT: 29.8 % — AB (ref 36.0–46.0)
HEMOGLOBIN: 9 g/dL — AB (ref 12.0–15.0)
MCH: 23.1 pg — ABNORMAL LOW (ref 26.0–34.0)
MCHC: 30.2 g/dL (ref 30.0–36.0)
MCV: 76.6 fL — AB (ref 80.0–100.0)
Platelets: 326 10*3/uL (ref 150–400)
RBC: 3.89 MIL/uL (ref 3.87–5.11)
RDW: 17.2 % — ABNORMAL HIGH (ref 11.5–15.5)
WBC: 4.5 10*3/uL (ref 4.0–10.5)
nRBC: 0 % (ref 0.0–0.2)

## 2018-08-08 LAB — URINALYSIS, ROUTINE W REFLEX MICROSCOPIC
BILIRUBIN URINE: NEGATIVE
GLUCOSE, UA: NEGATIVE mg/dL
HGB URINE DIPSTICK: NEGATIVE
Ketones, ur: NEGATIVE mg/dL
Nitrite: NEGATIVE
Protein, ur: 30 mg/dL — AB
SPECIFIC GRAVITY, URINE: 1.026 (ref 1.005–1.030)
pH: 6 (ref 5.0–8.0)

## 2018-08-08 LAB — WET PREP, GENITAL
Clue Cells Wet Prep HPF POC: NONE SEEN
SPERM: NONE SEEN
Trich, Wet Prep: NONE SEEN
Yeast Wet Prep HPF POC: NONE SEEN

## 2018-08-08 LAB — POCT PREGNANCY, URINE: Preg Test, Ur: NEGATIVE

## 2018-08-08 MED ORDER — KETOROLAC TROMETHAMINE 60 MG/2ML IM SOLN
60.0000 mg | Freq: Once | INTRAMUSCULAR | Status: DC
  Filled 2018-08-08: qty 2

## 2018-08-08 NOTE — MAU Provider Note (Signed)
History     CSN: [id number]  Arrival date and time: 08/08/18 1224   First Provider Initiated Contact with Patient 08/08/18 1319      Chief Complaint  Patient presents with  . Groin Pain   HPI Becky Young is a 28 y.o. G3P2011 non pregnant female who presents with pelvic pain. She states it is on the left side and has been ongoing "since she's been alive." She has no pain now and states she uses ibuprofen with relief. She is seen by a gyn at the [place] but has not made an appointment recently. She also reports an increase in clear discharge. She reports having bumps on her groin after switching soaps that have now resolved.   OB History    Gravida  3   Para  2   Term  2   Preterm  0   AB  1   Living  1     SAB  1   TAB  0   Ectopic  0   Multiple  0   Live Births  1           Past Medical History:  Diagnosis Date  . Arthritis   . Bursitis   . Chronic constipation   . Dysmenorrhea   . Eczema   . Iron deficiency anemia   . Scabies infestation     Past Surgical History:  Procedure Laterality Date  . CESAREAN SECTION      Family History  Problem Relation Age of Onset  . Hypertension Mother   . Cancer Maternal Grandmother     Social History   Tobacco Use  . Smoking status: Never Smoker  . Smokeless tobacco: Never Used  Substance Use Topics  . Alcohol use: No  . Drug use: No    Allergies:  Allergies  Allergen Reactions  . Almond Meal Hives and Itching  . Latex Itching  . Zofran Hives    Medications Prior to Admission  Medication Sig Dispense Refill Last Dose  . norgestimate-ethinyl estradiol (ORTHO-CYCLEN,SPRINTEC,PREVIFEM) 0.25-35 MG-MCG tablet Take 1 tablet by mouth daily. (Patient not taking: Reported on 08/08/2018) 1 Package 11 Not Taking at Unknown time    Review of Systems  Constitutional: Negative.  Negative for fatigue and fever.  HENT: Negative.   Respiratory: Negative.  Negative for shortness of breath.   Cardiovascular:  Negative.  Negative for chest pain.  Gastrointestinal: Negative.  Negative for abdominal pain, constipation, diarrhea, nausea and vomiting.  Genitourinary: Positive for pelvic pain and vaginal discharge. Negative for dysuria.  Neurological: Negative.  Negative for dizziness and headaches.   Physical Exam   Blood pressure 107/62, pulse 91, temperature 98.5 F (36.9 C), temperature source Oral, resp. rate 20, height 5\' 5"  (1.651 m), weight 61.5 kg, last menstrual period 07/14/2018, unknown if currently breastfeeding.  Physical Exam  Nursing note and vitals reviewed. Constitutional: She is oriented to person, place, and time. She appears well-developed and well-nourished. No distress.  HENT:  Head: Normocephalic.  Eyes: Pupils are equal, round, and reactive to light.  Cardiovascular: Normal rate, regular rhythm and normal heart sounds.  Respiratory: Effort normal and breath sounds normal. No respiratory distress.  GI: Soft. Bowel sounds are normal. She exhibits no distension. There is no abdominal tenderness.  Neurological: She is alert and oriented to person, place, and time.  Skin: Skin is warm and dry.  Psychiatric: She has a normal mood and affect. Her behavior is normal. Judgment and thought content normal.  MAU Course  Procedures Results for orders placed or performed during the hospital encounter of 08/08/18 (from the past 24 hour(s))  Urinalysis, Routine w reflex microscopic     Status: Abnormal   Collection Time: 08/08/18 12:43 PM  Result Value Ref Range   Color, Urine YELLOW YELLOW   APPearance CLOUDY (A) CLEAR   Specific Gravity, Urine 1.026 1.005 - 1.030   pH 6.0 5.0 - 8.0   Glucose, UA NEGATIVE NEGATIVE mg/dL   Hgb urine dipstick NEGATIVE NEGATIVE   Bilirubin Urine NEGATIVE NEGATIVE   Ketones, ur NEGATIVE NEGATIVE mg/dL   Protein, ur 30 (A) NEGATIVE mg/dL   Nitrite NEGATIVE NEGATIVE   Leukocytes, UA MODERATE (A) NEGATIVE   RBC / HPF 0-5 0 - 5 RBC/hpf   WBC, UA  11-20 0 - 5 WBC/hpf   Bacteria, UA FEW (A) NONE SEEN   Squamous Epithelial / LPF 21-50 0 - 5   Mucus PRESENT   Pregnancy, urine POC     Status: None   Collection Time: 08/08/18 12:47 PM  Result Value Ref Range   Preg Test, Ur NEGATIVE NEGATIVE  CBC     Status: Abnormal   Collection Time: 08/08/18  1:12 PM  Result Value Ref Range   WBC 4.5 4.0 - 10.5 K/uL   RBC 3.89 3.87 - 5.11 MIL/uL   Hemoglobin 9.0 (L) 12.0 - 15.0 g/dL   HCT 81.129.8 (L) 91.436.0 - 78.246.0 %   MCV 76.6 (L) 80.0 - 100.0 fL   MCH 23.1 (L) 26.0 - 34.0 pg   MCHC 30.2 30.0 - 36.0 g/dL   RDW 95.617.2 (H) 21.311.5 - 08.615.5 %   Platelets 326 150 - 400 K/uL   nRBC 0.0 0.0 - 0.2 %  Wet prep, genital     Status: Abnormal   Collection Time: 08/08/18  2:18 PM  Result Value Ref Range   Yeast Wet Prep HPF POC NONE SEEN NONE SEEN   Trich, Wet Prep NONE SEEN NONE SEEN   Clue Cells Wet Prep HPF POC NONE SEEN NONE SEEN   WBC, Wet Prep HPF POC FEW (A) NONE SEEN   Sperm NONE SEEN    MDM UA, UPT, UC CBC Wet prep and gc/chlamydia Toradol IM- patient refused  Patient got dressed after vaginal swabs collected by RN. Would not undress to allow CNM to look at groin. Offered patient PO pain medication and patient declined stating her ride was here.   Patient requested referral for gyn appointment in case she cannot be seen by her regular gyn. Encouraged patient to keep appointment here or call and cancel if she decides to not come.  Assessment and Plan   1. Pelvic pain    -Discharge home in stable condition -Encouraged patient to use ibuprofen for pain management -Abdominal pain precautions discussed -Patient advised to follow-up with gyn for management of chronic pelvic pain -Patient may return to MAU as needed or if her condition were to change or worsen   Rolm BookbinderCaroline M Markitta Ausburn CNM 08/08/2018, 1:19 PM

## 2018-08-08 NOTE — Discharge Instructions (Signed)
Pelvic Pain, Female Pelvic pain is pain in your lower abdomen, below your belly button and between your hips. The pain may start suddenly (be acute), keep coming back (be recurring), or last a long time (become chronic). Pelvic pain that lasts longer than 6 months is considered chronic. Pelvic pain may affect your:  Reproductive organs.  Urinary system.  Digestive tract.  Musculoskeletal system. There are many potential causes of pelvic pain. Sometimes, the pain can be a result of digestive or urinary conditions, strained muscles or ligaments, or reproductive conditions. Sometimes the cause of pelvic pain is not known. Follow these instructions at home:   Take over-the-counter and prescription medicines only as told by your health care provider.  Rest as told by your health care provider.  Do not have sex if it hurts.  Keep a journal of your pelvic pain. Write down: ? When the pain started. ? Where the pain is located. ? What seems to make the pain better or worse, such as food or your period (menstrual cycle). ? Any symptoms you have along with the pain.  Keep all follow-up visits as told by your health care provider. This is important. Contact a health care provider if:  Medicine does not help your pain.  Your pain comes back.  You have new symptoms.  You have abnormal vaginal discharge or bleeding, including bleeding after menopause.  You have a fever or chills.  You are constipated.  You have blood in your urine or stool.  You have foul-smelling urine.  You feel weak or light-headed. Get help right away if:  You have sudden severe pain.  Your pain gets steadily worse.  You have severe pain along with fever, nausea, vomiting, or excessive sweating.  You lose consciousness. Summary  Pelvic pain is pain in your lower abdomen, below your belly button and between your hips.  There are many potential causes of pelvic pain.  Keep a journal of your pelvic  pain. This information is not intended to replace advice given to you by your health care provider. Make sure you discuss any questions you have with your health care provider. Document Released: 05/30/2004 Document Revised: 12/19/2017 Document Reviewed: 12/19/2017 Elsevier Interactive Patient Education  2019 [employer]vier Inc[employer]  

## 2018-08-08 NOTE — MAU Note (Addendum)
Presents with c/oleft groin pain.  Reports large amount clear discharge, states no odor.  States Hx of frequent yeast infections and self treated with 1 day OTC product.  LMP 07/14/2018.

## 2018-08-09 LAB — URINE CULTURE: Culture: >=100000 — AB

## 2018-08-09 LAB — GC/CHLAMYDIA PROBE AMP (~~LOC~~) NOT AT ARMC
CHLAMYDIA, DNA PROBE: NEGATIVE
NEISSERIA GONORRHEA: NEGATIVE

## 2018-09-05 ENCOUNTER — Encounter: Payer: Medicaid Other | Admitting: Obstetrics & Gynecology

## 2019-04-03 ENCOUNTER — Other Ambulatory Visit: Payer: Self-pay

## 2019-04-03 ENCOUNTER — Ambulatory Visit (HOSPITAL_COMMUNITY)
Admission: EM | Admit: 2019-04-03 | Discharge: 2019-04-03 | Disposition: A | Discharge status: Home / Self Care | Payer: Medicaid Other | Attending: Family Medicine | Admitting: Family Medicine

## 2019-04-03 ENCOUNTER — Encounter (HOSPITAL_COMMUNITY): Payer: Self-pay

## 2019-04-03 DIAGNOSIS — N3001 Acute cystitis with hematuria: Secondary | ICD-10-CM | POA: Insufficient documentation

## 2019-04-03 DIAGNOSIS — Z3202 Encounter for pregnancy test, result negative: Secondary | ICD-10-CM | POA: Diagnosis not present

## 2019-04-03 LAB — POCT URINALYSIS DIP (DEVICE)
Bilirubin Urine: NEGATIVE
Glucose, UA: NEGATIVE mg/dL
Ketones, ur: NEGATIVE mg/dL
Nitrite: NEGATIVE
Protein, ur: NEGATIVE mg/dL
Specific Gravity, Urine: >=1.03 (ref 1.005–1.030)
Urobilinogen, UA: 0.2 mg/dL (ref 0.0–1.0)
pH: 6 (ref 5.0–8.0)

## 2019-04-03 LAB — POCT PREGNANCY, URINE: Preg Test, Ur: NEGATIVE

## 2019-04-03 MED ORDER — NITROFURANTOIN MONOHYD MACRO 100 MG PO CAPS: 100.0000 mg | ORAL_CAPSULE | Freq: Two times a day (BID) | ORAL | 0 refills | Status: DC

## 2019-04-03 MED ORDER — FLUCONAZOLE 150 MG PO TABS: 150.0000 mg | ORAL_TABLET | Freq: Every day | ORAL | 0 refills | Status: DC

## 2019-04-03 NOTE — ED Provider Notes (Signed)
[facility]    CSN: [id number] Arrival date & time: 04/03/19  9798      History   Chief Complaint Chief Complaint  Patient presents with   Urinary Tract Infection    HPI Becky Young is a 28 y.o. female.   Patient is a 28 year old female past medical history of arthritis, bursitis, chronic constipation, dysmenorrhea, eczema, anemia.  She presents today with approximately 2 to 3 days of bladder spasming and mild suprapubic discomfort.  Symptoms have been intermittent.  Concern for urinary tract infection.  Denies any dysuria, hematuria.  Denies any vaginal discharge, itching or irritation.  No fever.  ROS per HPI      Past Medical History:  Diagnosis Date   Arthritis    Bursitis    Chronic constipation    Dysmenorrhea    Eczema    Iron deficiency anemia    Scabies infestation     Patient Active Problem List   Diagnosis Date Noted   Chronic constipation 06/04/2012   Lactose intolerance 06/04/2012   HYPERLIPIDEMIA 06/08/2007   ANEMIA-IRON DEFICIENCY 06/08/2007   MIGRAINE HEADACHE 06/08/2007   ALLERGIC RHINITIS 06/08/2007   ASTHMA 06/08/2007   REDUCTION MAMMOPLASTY, HX OF 06/08/2007    Past Surgical History:  Procedure Laterality Date   CESAREAN SECTION      OB History    Gravida  3   Para  2   Term  2   Preterm  0   AB  1   Living  1     SAB  1   TAB  0   Ectopic  0   Multiple  0   Live Births  1            Home Medications    Prior to Admission medications   Medication Sig Start Date End Date Taking? Authorizing Provider  fluconazole (DIFLUCAN) 150 MG tablet Take 1 tablet (150 mg total) by mouth daily. 04/03/19   Loura Halt A, NP  nitrofurantoin, macrocrystal-monohydrate, (MACROBID) 100 MG capsule Take 1 capsule (100 mg total) by mouth 2 (two) times daily. 04/03/19   Loura Halt A, NP  norgestimate-ethinyl estradiol (ORTHO-CYCLEN,SPRINTEC,PREVIFEM) 0.25-35 MG-MCG tablet Take 1 tablet by mouth  daily. Patient not taking: Reported on 08/08/2018 10/03/17   Katheren Shams, DO    Family History Family History  Problem Relation Age of Onset   Hypertension Mother    Cancer Maternal Grandmother     Social History Social History   Tobacco Use   Smoking status: Never Smoker   Smokeless tobacco: Never Used  Substance Use Topics   Alcohol use: No   Drug use: No     Allergies   Almond meal, Latex, and Zofran   Review of Systems Review of Systems   Physical Exam Triage Vital Signs ED Triage Vitals  Enc Vitals Group     BP 04/03/19 0946 112/79     Pulse --      Resp 04/03/19 0946 16     Temp 04/03/19 0946 98 F (36.7 C)     Temp Source 04/03/19 0946 Oral     SpO2 04/03/19 0946 100 %     Weight 04/03/19 0944 125 lb (56.7 kg)     Height --      Head Circumference --      Peak Flow --      Pain Score 04/03/19 0944 6     Pain Loc --      Pain Edu? --  Excl. in GC? --    No data found.  Updated Vital Signs BP 112/79 (BP Location: Right Arm)    Temp 98 F (36.7 C) (Oral)    Resp 16    Wt 125 lb (56.7 kg)    LMP 04/02/2019    SpO2 100%    BMI 20.80 kg/m   Visual Acuity Right Eye Distance:   Left Eye Distance:   Bilateral Distance:    Right Eye Near:   Left Eye Near:    Bilateral Near:     Physical Exam Vitals signs and nursing note reviewed.  Constitutional:      General: She is not in acute distress.    Appearance: Normal appearance. She is not ill-appearing, toxic-appearing or diaphoretic.  HENT:     Head: Normocephalic.     Nose: Nose normal.     Mouth/Throat:     Pharynx: Oropharynx is clear.  Eyes:     Conjunctiva/sclera: Conjunctivae normal.  Neck:     Musculoskeletal: Normal range of motion.  Pulmonary:     Effort: Pulmonary effort is normal.  Abdominal:     Palpations: Abdomen is soft.     Tenderness: There is no abdominal tenderness.  Musculoskeletal: Normal range of motion.  Skin:    General: Skin is warm and dry.      Findings: No rash.  Neurological:     Mental Status: She is alert.  Psychiatric:        Mood and Affect: Mood normal.      UC Treatments / Results  Labs (all labs ordered are listed, but only abnormal results are displayed) Labs Reviewed  POCT URINALYSIS DIP (DEVICE) - Abnormal; Notable for the following components:      Result Value   Hgb urine dipstick LARGE (*)    Leukocytes,Ua TRACE (*)    All other components within normal limits  URINE CULTURE  POC URINE PREG, ED  POCT PREGNANCY, URINE    EKG   Radiology No results found.  Procedures Procedures (including critical care time)  Medications Ordered in UC Medications - No data to display  Initial Impression / Assessment and Plan / UC Course  I have reviewed the triage vital signs and the nursing notes.  Pertinent labs & imaging results that were available during my care of the patient were reviewed by me and considered in my medical decision making (see chart for details).     Urine with trace leuks and large hemoglobin. Sending for urine culture Will treat with Macrobid in the meantime Diflucan for yeast infection Follow up as needed for continued or worsening symptoms  Final Clinical Impressions(s) / UC Diagnoses   Final diagnoses:  Acute cystitis with hematuria     Discharge Instructions     Treating you for UTI  Take the medication as prescribed We will send  your urine for culture Follow up as needed for continued or worsening symptoms     ED Prescriptions    Medication Sig Dispense Auth. Provider   nitrofurantoin, macrocrystal-monohydrate, (MACROBID) 100 MG capsule Take 1 capsule (100 mg total) by mouth 2 (two) times daily. 10 capsule Ayinde Swim A, NP   fluconazole (DIFLUCAN) 150 MG tablet Take 1 tablet (150 mg total) by mouth daily. 2 tablet Dahlia ByesBast, Shyonna Carlin A, NP     Controlled Substance Prescriptions [place] Controlled Substance Registry consulted? Not Applicable   Janace ArisBast, Shaterica Mcclatchy A,  NP 04/03/19 1042

## 2019-04-03 NOTE — Discharge Instructions (Signed)
Treating you for UTI  Take the medication as prescribed We will send  your urine for culture Follow up as needed for continued or worsening symptoms

## 2019-04-03 NOTE — ED Triage Notes (Signed)
Pt states she thinks she has a UTI. X 2 days.

## 2019-04-04 LAB — URINE CULTURE: Culture: 30000 — AB

## 2019-04-07 ENCOUNTER — Telehealth (HOSPITAL_COMMUNITY): Payer: Self-pay | Admitting: Emergency Medicine

## 2019-04-07 NOTE — Telephone Encounter (Signed)
Pt called asking about urine culture. Results given, shows contaminants. Pt denies symptoms. Encouraged pt if she develops symptoms to follow up.

## 2019-06-13 ENCOUNTER — Other Ambulatory Visit: Payer: Self-pay

## 2019-06-13 ENCOUNTER — Ambulatory Visit (HOSPITAL_COMMUNITY)
Admission: EM | Admit: 2019-06-13 | Discharge: 2019-06-13 | Disposition: A | Discharge status: Home / Self Care | Payer: Medicaid Other | Attending: Family Medicine | Admitting: Family Medicine

## 2019-06-13 ENCOUNTER — Ambulatory Visit (INDEPENDENT_AMBULATORY_CARE_PROVIDER_SITE_OTHER): Payer: Medicaid Other

## 2019-06-13 ENCOUNTER — Encounter (HOSPITAL_COMMUNITY): Payer: Self-pay

## 2019-06-13 DIAGNOSIS — R109 Unspecified abdominal pain: Secondary | ICD-10-CM | POA: Insufficient documentation

## 2019-06-13 DIAGNOSIS — K5900 Constipation, unspecified: Secondary | ICD-10-CM | POA: Diagnosis not present

## 2019-06-13 DIAGNOSIS — Z3202 Encounter for pregnancy test, result negative: Secondary | ICD-10-CM

## 2019-06-13 DIAGNOSIS — R102 Pelvic and perineal pain: Secondary | ICD-10-CM | POA: Diagnosis not present

## 2019-06-13 HISTORY — DX: Cardiac murmur, unspecified: R01.1

## 2019-06-13 LAB — POCT URINALYSIS DIP (DEVICE)
Bilirubin Urine: NEGATIVE
Glucose, UA: NEGATIVE mg/dL
Hgb urine dipstick: NEGATIVE
Ketones, ur: NEGATIVE mg/dL
Leukocytes,Ua: NEGATIVE
Nitrite: NEGATIVE
Protein, ur: NEGATIVE mg/dL
Specific Gravity, Urine: 1.02 (ref 1.005–1.030)
Urobilinogen, UA: 0.2 mg/dL (ref 0.0–1.0)
pH: 7.5 (ref 5.0–8.0)

## 2019-06-13 LAB — POC URINE PREG, ED: Preg Test, Ur: NEGATIVE

## 2019-06-13 LAB — POCT PREGNANCY, URINE: Preg Test, Ur: NEGATIVE

## 2019-06-13 MED ORDER — IBUPROFEN 800 MG PO TABS
800.0000 mg | ORAL_TABLET | Freq: Once | ORAL | Status: AC
  Administered 2019-06-13: 13:00:00 800 mg via ORAL

## 2019-06-13 MED ORDER — ACETAMINOPHEN 325 MG PO TABS: 650.0000 mg | ORAL_TABLET | Freq: Once | ORAL | Status: DC

## 2019-06-13 MED ORDER — POLYETHYLENE GLYCOL 3350 17 GM/SCOOP PO POWD: 1.0000 | Freq: Once | ORAL | 0 refills | Status: AC

## 2019-06-13 MED ORDER — IBUPROFEN 800 MG PO TABS
ORAL_TABLET | ORAL | Status: AC
  Filled 2019-06-13: qty 1

## 2019-06-13 NOTE — Discharge Instructions (Signed)
Urine results were negative today. I have sent a sample for culture to ensure no early underlying urinary infection is developing. X-ray negative for renal stone. X-ray showed moderate stool in your colon-which you are being prescribed Miralax to reduce stool burden. Encouraged use of  Miralax and light non fat diet right in clear liquids and bland foods for the next 24-48 hours to reduce abdominal and flank pain which is likely related to bowel gas. Follow-up with GI provider at the [facility] for additional work-up if symptoms persist.

## 2019-06-13 NOTE — ED Provider Notes (Signed)
[facility]    CSN: [id number] Arrival date & time: 06/13/19  7867    History   Chief Complaint Chief Complaint  Patient presents with  . Flank Pain  . Pelvic Pain    HPI Becky Young is a 28 y.o. female.   HPI  Patient presents today with multiple complaints. Reports she is followed routinely at the [facility] for GI ad GYN problems. Today she endorses pelvic pain and flank pain bilaterally. Endorses intermittent dysuria.  Endorse chronic abdominal pain for which she is prescribed dicyclomine. She endorses last menstrual cycle 05/25/19 -06/08/19. Denies nausea, vomiting, HA, or odor of urine.  Past Medical History:  Diagnosis Date  . Arthritis   . Bursitis   . Chronic constipation   . Dysmenorrhea   . Eczema   . Heart murmur   . Iron deficiency anemia   . Scabies infestation     Patient Active Problem List   Diagnosis Date Noted  . Chronic constipation 06/04/2012  . Lactose intolerance 06/04/2012  . HYPERLIPIDEMIA 06/08/2007  . ANEMIA-IRON DEFICIENCY 06/08/2007  . MIGRAINE HEADACHE 06/08/2007  . ALLERGIC RHINITIS 06/08/2007  . ASTHMA 06/08/2007  . REDUCTION MAMMOPLASTY, HX OF 06/08/2007    Past Surgical History:  Procedure Laterality Date  . CESAREAN SECTION      OB History    Gravida  3   Para  2   Term  2   Preterm  0   AB  1   Living  1     SAB  1   TAB  0   Ectopic  0   Multiple  0   Live Births  1            Home Medications    Prior to Admission medications   Medication Sig Start Date End Date Taking? Authorizing Provider  fluconazole (DIFLUCAN) 150 MG tablet Take 1 tablet (150 mg total) by mouth daily. 04/03/19   Dahlia Byes A, NP  nitrofurantoin, macrocrystal-monohydrate, (MACROBID) 100 MG capsule Take 1 capsule (100 mg total) by mouth 2 (two) times daily. 04/03/19   Dahlia Byes A, NP  norgestimate-ethinyl estradiol (ORTHO-CYCLEN,SPRINTEC,PREVIFEM) 0.25-35 MG-MCG tablet Take 1 tablet by mouth daily. Patient not taking:  Reported on 08/08/2018 10/03/17   Pincus Large, DO    Family History Family History  Problem Relation Age of Onset  . Hypertension Mother   . Cancer Maternal Grandmother     Social History Social History   Tobacco Use  . Smoking status: Never Smoker  . Smokeless tobacco: Never Used  Substance Use Topics  . Alcohol use: No  . Drug use: No     Allergies   Almond meal, Latex, and Zofran   Review of Systems Review of Systems Pertinent negatives listed in HPI  Physical Exam Triage Vital Signs ED Triage Vitals [06/13/19 1122]  Enc Vitals Group     BP 125/66     Pulse Rate 80     Resp 18     Temp 99 F (37.2 C)     Temp Source Oral     SpO2 100 %     Weight      Height      Head Circumference      Peak Flow      Pain Score 9     Pain Loc      Pain Edu?      Excl. in GC?    No data found.  Updated Vital Signs BP  125/66 (BP Location: Right Arm)   Pulse 80   Temp 99 F (37.2 C) (Oral)   Resp 18   SpO2 100%   Visual Acuity Right Eye Distance:   Left Eye Distance:   Bilateral Distance:    Right Eye Near:   Left Eye Near:    Bilateral Near:     Physical Exam General appearance: alert, well developed, well nourished, cooperative and in no distress Head: Normocephalic, without obvious abnormality, atraumatic Respiratory: Respirations even and unlabored, normal respiratory rate Heart: rate and rhythm normal. No gallop or murmurs noted on exam  Abdomen: BS +, no distention, no rebound tenderness, or no mass Extremities: No gross deformities Skin: Skin color, texture, turgor normal. No rashes seen  Psych: Appropriate mood and affect. Neurologic: Mental status: Alert, oriented to person, place, and time, thought content appropriate. UC Treatments / Results  Labs (all labs ordered are listed, but only abnormal results are displayed) Labs Reviewed  POC URINE PREG, ED  POCT URINALYSIS DIP (DEVICE)  CERVICOVAGINAL ANCILLARY ONLY    EKG   Radiology  No results found.  Dg Abd 1 View  Result Date: 06/13/2019 CLINICAL DATA:  Flank pain EXAM: ABDOMEN - 1 VIEW COMPARISON:  None. FINDINGS: There is moderate stool in the colon. There is no bowel dilatation or air-fluid level to suggest bowel obstruction. No free air. No abnormal calcifications. IMPRESSION: Moderate stool in colon.  No bowel obstruction or free air evident. Electronically Signed   By: Lowella Grip III M.D.   On: 06/13/2019 13:18     Procedures Procedures (including critical care time)  Medications Ordered in UC Medications - No data to display  Initial Impression / Assessment and Plan / UC Course  I have reviewed the triage vital signs and the nursing notes.  Pertinent labs & imaging results that were available during my care of the patient were reviewed by me and considered in my medical decision making (see chart for details).   UA unremarkable today, will obtain a urine culture. DG 1 view completed today to rule out renal calculi. DG 1 view was significant for increase stool burden. Recommended Miralax to reduce stool burden. Patient advised will notify her via phone if culture growth is significant. Patient verbalized understanding and agreement with plan.   Final Clinical Impressions(s) / UC Diagnoses   Final diagnoses:  Flank pain  Pelvic pain  Constipation, unspecified constipation type     Discharge Instructions     Urine results were negative today. I have sent a sample for culture to ensure no early underlying urinary infection is developing. X-ray negative for renal stone. X-ray showed moderate stool in your colon-which you are being prescribed Miralax to reduce stool burden. Encouraged use of  Miralax and light non fat diet right in clear liquids and bland foods for the next 24-48 hours to reduce abdominal and flank pain which is likely related to bowel gas. Follow-up with GI provider at the [facility] for additional work-up if symptoms persist.    ED  Prescriptions    Medication Sig Dispense Auth. Provider   polyethylene glycol powder (MIRALAX) 17 GM/SCOOP powder Take 255 g by mouth once for 1 dose. 255 g Scot Jun, FNP     PDMP not reviewed this encounter.   Scot Jun, FNP 06/15/19 1434

## 2019-06-13 NOTE — ED Triage Notes (Signed)
Pt presents with recurrent bilateral flank pain and bilateral pelvic pain for over a week.

## 2019-06-15 LAB — URINE CULTURE: Culture: 30000 — AB

## 2019-06-16 ENCOUNTER — Telehealth (HOSPITAL_COMMUNITY): Payer: Self-pay | Admitting: Emergency Medicine

## 2019-06-16 NOTE — Telephone Encounter (Signed)
Attempted to reach patient to see how she was feeling regarding urine culture results, tried calling both numbers in record, both numbers stated "call cannot be completed at this time."

## 2019-06-17 LAB — CERVICOVAGINAL ANCILLARY ONLY
Bacterial vaginitis: NEGATIVE
Candida vaginitis: NEGATIVE
Chlamydia: NEGATIVE
Neisseria Gonorrhea: NEGATIVE
Trichomonas: NEGATIVE

## 2019-12-10 ENCOUNTER — Encounter: Payer: Self-pay | Admitting: Gastroenterology

## 2019-12-25 ENCOUNTER — Ambulatory Visit (HOSPITAL_COMMUNITY)
Admission: EM | Admit: 2019-12-25 | Discharge: 2019-12-25 | Disposition: A | Discharge status: Home / Self Care | Payer: Medicaid Other | Attending: Family Medicine | Admitting: Family Medicine

## 2019-12-25 ENCOUNTER — Encounter (HOSPITAL_COMMUNITY): Payer: Self-pay

## 2019-12-25 ENCOUNTER — Other Ambulatory Visit: Payer: Self-pay

## 2019-12-25 DIAGNOSIS — S46312A Strain of muscle, fascia and tendon of triceps, left arm, initial encounter: Secondary | ICD-10-CM

## 2019-12-25 DIAGNOSIS — M25561 Pain in right knee: Secondary | ICD-10-CM

## 2019-12-25 DIAGNOSIS — S46311A Strain of muscle, fascia and tendon of triceps, right arm, initial encounter: Secondary | ICD-10-CM

## 2019-12-25 DIAGNOSIS — S39012A Strain of muscle, fascia and tendon of lower back, initial encounter: Secondary | ICD-10-CM

## 2019-12-25 MED ORDER — CYCLOBENZAPRINE HCL 10 MG PO TABS: ORAL_TABLET | ORAL | 0 refills | Status: AC

## 2019-12-25 MED ORDER — IBUPROFEN 800 MG PO TABS: 800.0000 mg | ORAL_TABLET | Freq: Three times a day (TID) | ORAL | 0 refills | Status: AC

## 2019-12-25 MED ORDER — TRAMADOL HCL 50 MG PO TABS: 50.0000 mg | ORAL_TABLET | Freq: Four times a day (QID) | ORAL | 0 refills | Status: AC | PRN
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
## 2019-12-25 NOTE — Discharge Instructions (Addendum)
Be aware, you have been prescribed pain medications that may cause drowsiness. Do not combine with alcohol or other illicit drugs. Please do not drive, operate heavy machinery, or take part in activities that require making important decisions while on this medication as your judgement may be clouded.  
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
## 2019-12-29 ENCOUNTER — Ambulatory Visit (INDEPENDENT_AMBULATORY_CARE_PROVIDER_SITE_OTHER): Payer: Medicaid Other

## 2019-12-29 ENCOUNTER — Other Ambulatory Visit: Payer: Self-pay

## 2019-12-29 ENCOUNTER — Ambulatory Visit (HOSPITAL_COMMUNITY)
Admission: EM | Admit: 2019-12-29 | Discharge: 2019-12-29 | Disposition: A | Discharge status: Home / Self Care | Payer: Medicaid Other | Attending: Family Medicine | Admitting: Family Medicine

## 2019-12-29 ENCOUNTER — Encounter (HOSPITAL_COMMUNITY): Payer: Self-pay

## 2019-12-29 DIAGNOSIS — S76111A Strain of right quadriceps muscle, fascia and tendon, initial encounter: Secondary | ICD-10-CM

## 2019-12-29 DIAGNOSIS — M25561 Pain in right knee: Secondary | ICD-10-CM | POA: Diagnosis not present
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
## 2019-12-29 NOTE — Discharge Instructions (Addendum)
Please try the brace  Please try ice  Please try ibuprofen for pain . Please follow up for further evaluation

## 2020-01-01 ENCOUNTER — Ambulatory Visit: Payer: Medicaid Other | Admitting: Gastroenterology

## 2020-01-02 ENCOUNTER — Ambulatory Visit: Payer: Self-pay

## 2020-01-02 ENCOUNTER — Encounter: Payer: Self-pay | Admitting: Family Medicine

## 2020-01-02 ENCOUNTER — Ambulatory Visit (INDEPENDENT_AMBULATORY_CARE_PROVIDER_SITE_OTHER): Payer: Medicaid Other | Admitting: Family Medicine

## 2020-01-02 ENCOUNTER — Other Ambulatory Visit: Payer: Self-pay

## 2020-01-02 VITALS — BP 126/87 | HR 73 | Ht 63.0 in | Wt 130.0 lb

## 2020-01-02 DIAGNOSIS — M25561 Pain in right knee: Secondary | ICD-10-CM

## 2020-01-02 DIAGNOSIS — S76111A Strain of right quadriceps muscle, fascia and tendon, initial encounter: Secondary | ICD-10-CM | POA: Diagnosis present

## 2020-01-02 NOTE — Patient Instructions (Signed)
Good to see you Please use ice as needed  Please continue the brace  Please try the exercises   Please send me a message in MyChart with any questions or updates.  Please see me back in 4 weeks.   --Dr. Jordan Likes
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
## 2020-01-02 NOTE — Progress Notes (Signed)
Medication Samples have been provided to the patient.  Drug name: Pennsaid       Strength: 2%        Qty: 2 Boxes  LOT: [id number]  Exp.Date: 08/2020  Dosing instructions: Use a pea size amount and rub gently.  The patient has been instructed regarding the correct time, dose, and frequency of taking this medication, including desired effects and most common side effects.   Kathi Simpers, MA 10:45 AM 01/02/2020

## 2020-01-29 ENCOUNTER — Ambulatory Visit (INDEPENDENT_AMBULATORY_CARE_PROVIDER_SITE_OTHER): Payer: Medicaid Other | Admitting: Family Medicine

## 2020-01-29 ENCOUNTER — Encounter: Payer: Self-pay | Admitting: Family Medicine

## 2020-01-29 ENCOUNTER — Other Ambulatory Visit: Payer: Self-pay

## 2020-01-29 VITALS — BP 129/85 | HR 91 | Ht 62.0 in | Wt 129.0 lb

## 2020-01-29 DIAGNOSIS — M25562 Pain in left knee: Secondary | ICD-10-CM | POA: Insufficient documentation

## 2020-01-29 DIAGNOSIS — S76111A Strain of right quadriceps muscle, fascia and tendon, initial encounter: Secondary | ICD-10-CM | POA: Diagnosis not present

## 2020-01-29 NOTE — Patient Instructions (Signed)
Good to see you Please continue the exercises for the right knee and try the new ones for the left  Please try the rub on medicine  Please send me a message in MyChart with any questions or updates.  Please see me back in 4 weeks or as needed.   --Dr. Jordan Likes

## 2020-01-29 NOTE — Assessment & Plan Note (Signed)
Pain occurring over the medial joint space.  Likely related to hip abduction weakness.  No effusion on exam.   - Counseled on home exercise therapy and supportive care. -Provided Pennsaid sample. -Could consider physical therapy.

## 2020-01-29 NOTE — Assessment & Plan Note (Signed)
Doing well with home exercises.  Has not had to use the brace. -Counseled on home exercise therapy and supportive care. -Could consider physical therapy if needed. -Follow-up as needed.

## 2020-01-29 NOTE — Progress Notes (Signed)
Medication Samples have been provided to the patient.  Drug name: Pennsaid       Strength: 2%        Qty: 1 box  LOT: [id number]  Exp.Date: 08/2020  Dosing instructions: use a pea size amount and rub gently.  The patient has been instructed regarding the correct time, dose, and frequency of taking this medication, including desired effects and most common side effects.   Kathi Simpers, MA 10:31 AM 01/29/2020

## 2020-01-29 NOTE — Progress Notes (Signed)
Becky Young - 29 y.o. female MRN [id number]  Date of birth: 1991/01/04  SUBJECTIVE:  Including CC & ROS.  Chief Complaint  Patient presents with  . Follow-up    right knee    Becky Young is a 29 y.o. female that is following up for her right quad tendon strain and presenting with left knee pain.  Left knee pain is occurring over the medial joint space.  It occurs intermittently.  It is relieved with ibuprofen.  The right knee is doing well.  She does not use the brace to help with ambulation.   Review of Systems See HPI   HISTORY: Past Medical, Surgical, Social, and Family History Reviewed & Updated per EMR.   Pertinent Historical Findings include:  Past Medical History:  Diagnosis Date  . Arthritis   . Bursitis   . Chronic constipation   . Dysmenorrhea   . Eczema   . Heart murmur   . Iron deficiency anemia   . Scabies infestation     Past Surgical History:  Procedure Laterality Date  . CESAREAN SECTION      Family History  Problem Relation Age of Onset  . Hypertension Mother   . Healthy Father   . Cancer Maternal Grandmother     Social History   Socioeconomic History  . Marital status: Single    Spouse name: Not on file  . Number of children: Not on file  . Years of education: Not on file  . Highest education level: Not on file  Occupational History  . Not on file  Tobacco Use  . Smoking status: Never Smoker  . Smokeless tobacco: Never Used  Vaping Use  . Vaping Use: Never used  Substance and Sexual Activity  . Alcohol use: No  . Drug use: No  . Sexual activity: Yes    Birth control/protection: None    Comment: depo provera  Other Topics Concern  . Not on file  Social History Narrative  . Not on file   Social Determinants of Health   Financial Resource Strain:   . Difficulty of Paying Living Expenses:   Food Insecurity:   . Worried About Programme researcher, broadcasting/film/video in the Last Year:   . Barista in the Last Year:   Transportation Needs:   .  Freight forwarder (Medical):   Marland Kitchen Lack of Transportation (Non-Medical):   Physical Activity:   . Days of Exercise per Week:   . Minutes of Exercise per Session:   Stress:   . Feeling of Stress :   Social Connections:   . Frequency of Communication with Friends and Family:   . Frequency of Social Gatherings with Friends and Family:   . Attends Religious Services:   . Active Member of Clubs or Organizations:   . Attends Banker Meetings:   Marland Kitchen Marital Status:   Intimate Partner Violence:   . Fear of Current or Ex-Partner:   . Emotionally Abused:   Marland Kitchen Physically Abused:   . Sexually Abused:      PHYSICAL EXAM:  VS: BP 129/85   Pulse 91   Ht 5\' 2"  (1.575 m)   Wt 129 lb (58.5 kg)   BMI 23.59 kg/m  Physical Exam Gen: NAD, alert, cooperative with exam, well-appearing MSK:  Right knee: No effusion. Normal range of motion. Left knee: Tenderness palpation of the medial joint space. Normal range of motion. No effusion. Weakness with hip abduction. Neurovascular intact  ASSESSMENT & PLAN:   Strain of quadriceps tendon, right, initial encounter Doing well with home exercises.  Has not had to use the brace. -Counseled on home exercise therapy and supportive care. -Could consider physical therapy if needed. -Follow-up as needed.  Acute pain of left knee Pain occurring over the medial joint space.  Likely related to hip abduction weakness.  No effusion on exam.   - Counseled on home exercise therapy and supportive care. -Provided Pennsaid sample. -Could consider physical therapy.

## 2020-01-30 ENCOUNTER — Ambulatory Visit: Payer: Medicaid Other | Admitting: Family Medicine

## 2021-10-08 ENCOUNTER — Emergency Department (HOSPITAL_BASED_OUTPATIENT_CLINIC_OR_DEPARTMENT_OTHER): Payer: Medicaid Other

## 2021-10-08 ENCOUNTER — Encounter (HOSPITAL_BASED_OUTPATIENT_CLINIC_OR_DEPARTMENT_OTHER): Payer: Self-pay

## 2021-10-08 ENCOUNTER — Other Ambulatory Visit: Payer: Self-pay

## 2021-10-08 ENCOUNTER — Emergency Department (HOSPITAL_BASED_OUTPATIENT_CLINIC_OR_DEPARTMENT_OTHER)
Admission: EM | Admit: 2021-10-08 | Discharge: 2021-10-08 | Disposition: A | Discharge status: Home / Self Care | Payer: Medicaid Other | Attending: Emergency Medicine | Admitting: Emergency Medicine

## 2021-10-08 DIAGNOSIS — Z91040 Latex allergy status: Secondary | ICD-10-CM | POA: Diagnosis not present

## 2021-10-08 DIAGNOSIS — K625 Hemorrhage of anus and rectum: Secondary | ICD-10-CM | POA: Diagnosis present

## 2021-10-08 DIAGNOSIS — R1030 Lower abdominal pain, unspecified: Secondary | ICD-10-CM | POA: Diagnosis not present

## 2021-10-08 LAB — URINALYSIS, ROUTINE W REFLEX MICROSCOPIC
Bilirubin Urine: NEGATIVE
Glucose, UA: NEGATIVE mg/dL
Ketones, ur: NEGATIVE mg/dL
Nitrite: NEGATIVE
Specific Gravity, Urine: 1.028 (ref 1.005–1.030)
pH: 6 (ref 5.0–8.0)

## 2021-10-08 LAB — CBC WITH DIFFERENTIAL/PLATELET
Abs Immature Granulocytes: 0.01 10*3/uL (ref 0.00–0.07)
Basophils Absolute: 0 10*3/uL (ref 0.0–0.1)
Basophils Relative: 1 %
Eosinophils Absolute: 0.2 10*3/uL (ref 0.0–0.5)
Eosinophils Relative: 3 %
HCT: 39.6 % (ref 36.0–46.0)
Hemoglobin: 12.9 g/dL (ref 12.0–15.0)
Immature Granulocytes: 0 %
Lymphocytes Relative: 34 %
Lymphs Abs: 2.3 10*3/uL (ref 0.7–4.0)
MCH: 28.7 pg (ref 26.0–34.0)
MCHC: 32.6 g/dL (ref 30.0–36.0)
MCV: 88 fL (ref 80.0–100.0)
Monocytes Absolute: 0.4 10*3/uL (ref 0.1–1.0)
Monocytes Relative: 6 %
Neutro Abs: 3.8 10*3/uL (ref 1.7–7.7)
Neutrophils Relative %: 56 %
Platelets: 292 10*3/uL (ref 150–400)
RBC: 4.5 MIL/uL (ref 3.87–5.11)
RDW: 11.9 % (ref 11.5–15.5)
WBC: 6.6 10*3/uL (ref 4.0–10.5)
nRBC: 0 % (ref 0.0–0.2)

## 2021-10-08 LAB — BASIC METABOLIC PANEL
Anion gap: 7 (ref 5–15)
BUN: 14 mg/dL (ref 6–20)
CO2: 25 mmol/L (ref 22–32)
Calcium: 9.4 mg/dL (ref 8.9–10.3)
Chloride: 107 mmol/L (ref 98–111)
Creatinine, Ser: 0.86 mg/dL (ref 0.44–1.00)
GFR, Estimated: >60 mL/min (ref >60)
Glucose, Bld: 74 mg/dL (ref 70–99)
Potassium: 3.7 mmol/L (ref 3.5–5.1)
Sodium: 139 mmol/L (ref 135–145)

## 2021-10-08 MED ORDER — LACTATED RINGERS IV SOLN: INTRAVENOUS | Status: DC

## 2021-10-08 MED ORDER — IOHEXOL 300 MG/ML  SOLN
100.0000 mL | Freq: Once | INTRAMUSCULAR | Status: AC | PRN
  Administered 2021-10-08: 100 mL via INTRAVENOUS

## 2021-10-08 NOTE — ED Triage Notes (Signed)
She c/o some chronic abd. Discomfort, which is slightly "worse" lately. She c/o bright red rectal bleeding this morning "that I've never had before". She is ambulatory and in no distress. ?

## 2021-10-08 NOTE — ED Notes (Signed)
RT in with Dr. Zenia Resides as chaperone for a rectal exam.  ?

## 2021-10-08 NOTE — ED Provider Notes (Signed)
?[facility] ?Provider Note ? ? ?CSN: [id number] ?Arrival date & time: 10/08/21  0909 ? ?  ? ?History ? ?No chief complaint on file. ? ? ?Becky Young is a 31 y.o. female. ? ?31 year old female presents for bright red blood per rectum x1 day.  Has had some lower abdominal cramping.  Has endometriosis and has had a hysterectomy and 1 ovary removed.  Denies any prior history of same.  Denies any vaginal symptoms.  No urinary symptoms.  Denies any history of colon cancer.  No blood thinner usage.  No prior history of same.  No treatment use prior to arrival ? ? ?  ? ?Home Medications ?Prior to Admission medications   ?Medication Sig Start Date End Date Taking? Authorizing Provider  ?cyclobenzaprine (FLEXERIL) 10 MG tablet Take 1 tablet by mouth 3 times daily as needed for muscle spasm. Warning: May cause drowsiness. 12/25/19   Vanessa Kick, MD  ?ibuprofen (ADVIL) 800 MG tablet Take 1 tablet (800 mg total) by mouth 3 (three) times daily with meals. 12/25/19   Vanessa Kick, MD  ?traMADol (ULTRAM) 50 MG tablet Take 1 tablet (50 mg total) by mouth every 6 (six) hours as needed. 12/25/19   Vanessa Kick, MD  ?norgestimate-ethinyl estradiol (ORTHO-CYCLEN,SPRINTEC,PREVIFEM) 0.25-35 MG-MCG tablet Take 1 tablet by mouth daily. ?Patient not taking: Reported on 08/08/2018 10/03/17 12/29/19  Katheren Shams, DO  ?   ? ?Allergies    ?Almond meal, Latex, and Zofran   ? ?Review of Systems   ?Review of Systems  ?All other systems reviewed and are negative. ? ?Physical Exam ?Updated Vital Signs ?LMP  (LMP Unknown)  ?Physical Exam ?Vitals and nursing note reviewed. Exam conducted with a chaperone present.  ?Constitutional:   ?   General: She is not in acute distress. ?   Appearance: Normal appearance. She is well-developed. She is not toxic-appearing.  ?HENT:  ?   Head: Normocephalic and atraumatic.  ?Eyes:  ?   General: Lids are normal.  ?   Conjunctiva/sclera: Conjunctivae normal.  ?   Pupils: Pupils are equal,  round, and reactive to light.  ?Neck:  ?   Thyroid: No thyroid mass.  ?   Trachea: No tracheal deviation.  ?Cardiovascular:  ?   Rate and Rhythm: Normal rate and regular rhythm.  ?   Heart sounds: Normal heart sounds. No murmur heard. ?  No gallop.  ?Pulmonary:  ?   Effort: Pulmonary effort is normal. No respiratory distress.  ?   Breath sounds: Normal breath sounds. No stridor. No decreased breath sounds, wheezing, rhonchi or rales.  ?Abdominal:  ?   General: There is no distension.  ?   Palpations: Abdomen is soft.  ?   Tenderness: There is no abdominal tenderness. There is no rebound.  ?Genitourinary: ?   Comments: Some blood noted on DRE ?Musculoskeletal:     ?   General: No tenderness. Normal range of motion.  ?   Cervical back: Normal range of motion and neck supple.  ?Skin: ?   General: Skin is warm and dry.  ?   Findings: No abrasion or rash.  ?Neurological:  ?   Mental Status: She is alert and oriented to person, place, and time. Mental status is at baseline.  ?   GCS: GCS eye subscore is 4. GCS verbal subscore is 5. GCS motor subscore is 6.  ?   Cranial Nerves: No cranial nerve deficit.  ?   Sensory: No sensory deficit.  ?  Motor: Motor function is intact.  ?Psychiatric:     ?   Attention and Perception: Attention normal.     ?   Speech: Speech normal.     ?   Behavior: Behavior normal.  ? ? ?ED Results / Procedures / Treatments   ?Labs ?(all labs ordered are listed, but only abnormal results are displayed) ?Labs Reviewed  ?CBC WITH DIFFERENTIAL/PLATELET  ?BASIC METABOLIC PANEL  ?URINALYSIS, ROUTINE W REFLEX MICROSCOPIC  ? ? ?EKG ?None ? ?Radiology ?No results found. ? ?Procedures ?Procedures  ? ? ?Medications Ordered in ED ?Medications  ?lactated ringers infusion (has no administration in time range)  ? ? ?ED Course/ Medical Decision Making/ A&P ?  ?                        ?Medical Decision Making ?Amount and/or Complexity of Data Reviewed ?Labs: ordered. ?Radiology: ordered. ? ?Risk ?Prescription drug  management. ? ? ?Patient presented with rectal bleeding here.  Concern for possible colonic involvement and abdominal CT performed and per my interpretation and review did not show any acute findings to explain this.  Patient's hemoglobin is stable here.  Pain is well controlled.  Urinalysis noted and is likely contaminated.  Patient's BUN/creatinine are stable therefore do not think that she has a serious GI bleed.  Plan will be for her to follow-up with a GI doctor at the [facility] clinic ? ? ? ? ? ? ? ?Final Clinical Impression(s) / ED Diagnoses ?Final diagnoses:  ?None  ? ? ?Rx / DC Orders ?ED Discharge Orders   ? ? None  ? ?  ? ? ?  ?Lacretia Leigh, MD ?10/08/21 1146 ? ?

## 2022-04-13 ENCOUNTER — Encounter (HOSPITAL_COMMUNITY): Payer: Self-pay | Admitting: Emergency Medicine

## 2022-04-13 ENCOUNTER — Other Ambulatory Visit: Payer: Self-pay

## 2022-04-13 ENCOUNTER — Ambulatory Visit (HOSPITAL_COMMUNITY)
Admission: EM | Admit: 2022-04-13 | Discharge: 2022-04-13 | Disposition: A | Discharge status: Home / Self Care | Payer: Medicaid Other

## 2022-04-13 DIAGNOSIS — K089 Disorder of teeth and supporting structures, unspecified: Secondary | ICD-10-CM

## 2022-04-13 DIAGNOSIS — Z7689 Persons encountering health services in other specified circumstances: Secondary | ICD-10-CM

## 2022-04-13 NOTE — ED Triage Notes (Signed)
Top and bottom on left side of mouth is hurting.  Pain since February.  Salt water, peroxide, and old antibiotics at home.  No antibiotics in the last month.  Patient does not have a Pharmacist, community.

## 2022-04-13 NOTE — ED Provider Notes (Signed)
Presents for left side mouth pain since February Reports fear of dentist and has not been to see one. Feels her wisdom teeth have come in causing discomfort  When asking about medication use, patient reports ibuprofen and tylenol don't help her. Started to discuss other options and the need to be evaluated by dentist when patient got up and walked out of the room. Left the facility.   Becky Young, [place], [place] 04/13/22 [phone]

## 2022-04-14 ENCOUNTER — Ambulatory Visit
Admission: EM | Admit: 2022-04-14 | Discharge: 2022-04-14 | Disposition: A | Discharge status: Home / Self Care | Payer: No Typology Code available for payment source

## 2022-04-14 DIAGNOSIS — K047 Periapical abscess without sinus: Secondary | ICD-10-CM

## 2022-04-14 MED ORDER — AMOXICILLIN-POT CLAVULANATE 875-125 MG PO TABS: 1.0000 | ORAL_TABLET | Freq: Two times a day (BID) | ORAL | 0 refills | Status: AC

## 2022-04-14 MED ORDER — MELOXICAM 15 MG PO TABS: 15.0000 mg | ORAL_TABLET | Freq: Every day | ORAL | 1 refills | Status: AC

## 2022-04-14 MED ORDER — LIDOCAINE VISCOUS HCL 2 % MT SOLN: OROMUCOSAL | 0 refills | Status: AC

## 2022-04-14 NOTE — Discharge Instructions (Addendum)
Make sure you schedule an appointment with a dentist/dental surgeon as soon as possible.  You may try some of the resources below.    [facility] in [place], [place] Address: [address], [place], [place] [address] Phone: [phone]  [facility] [phone] extension [phone]  Dr. Civils [phone] [address]  [facility] [phone] [address]  [facility] [phone] extension [phone] [place], [place], [address] First come first serve for the first 10 clients.  May do simple extractions only, no wisdom teeth or surgery.  You may try the second for Thursday of the month starting at 6:30 AM.  [employer] You may call the school to see if they are still helping to provide dental care for emergent cases.  

## 2022-04-14 NOTE — ED Provider Notes (Signed)
[facility]  Note:  This document was prepared using Systems analyst and may include unintentional dictation errors.  MRN: [id number] DOB: 04-Jul-1991  Subjective:   Becky Young is a 31 y.o. female presenting for 1 week history of recurrent severe left-sided upper and lower dental pain with difficulty chewing and radiation of her pain into her jaw and upwards into her face.  Patient notes that she has dental issues and has not been able to get in with a dentist.  Patient reports that she was seen yesterday and requesting antibiotic.  Reports that the provider refused antibiotics and therefore she walked out.  She has been using Tylenol and ibuprofen regularly for her pain.  Has not gotten any relief of this.  No current facility-administered medications for this encounter.  Current Outpatient Medications:    diazepam (VALIUM) 2 MG tablet, Place 2 mg into feeding tube as needed. Insert 1 tablet into the vagina as needed for vaginal pain, Disp: , Rfl:    cyclobenzaprine (FLEXERIL) 10 MG tablet, Take 1 tablet by mouth 3 times daily as needed for muscle spasm. Warning: May cause drowsiness. (Patient not taking: Reported on 04/13/2022), Disp: 21 tablet, Rfl: 0   ibuprofen (ADVIL) 800 MG tablet, Take 1 tablet (800 mg total) by mouth 3 (three) times daily with meals., Disp: 21 tablet, Rfl: 0   traMADol (ULTRAM) 50 MG tablet, Take 1 tablet (50 mg total) by mouth every 6 (six) hours as needed. (Patient not taking: Reported on 04/13/2022), Disp: 15 tablet, Rfl: 0   Allergies  Allergen Reactions   Almond Meal Hives and Itching   Latex Itching   Zofran Hives    Past Medical History:  Diagnosis Date   Arthritis    Bursitis    Chronic constipation    Dysmenorrhea    Eczema    Heart murmur    Iron deficiency anemia    Scabies infestation      Past Surgical History:  Procedure Laterality Date   ABDOMINAL HYSTERECTOMY     CESAREAN SECTION      COLONOSCOPY      Family History  Problem Relation Age of Onset   Hypertension Mother    Healthy Father    Cancer Maternal Grandmother     Social History   Tobacco Use   Smoking status: Never   Smokeless tobacco: Never  Vaping Use   Vaping Use: Never used  Substance Use Topics   Alcohol use: No   Drug use: No    ROS   Objective:   Vitals: Temp 98.1 F (36.7 C)   LMP  (LMP Unknown)   Physical Exam Constitutional:      General: She is not in acute distress.    Appearance: Normal appearance. She is well-developed. She is not ill-appearing, toxic-appearing or diaphoretic.  HENT:     Head: Normocephalic and atraumatic.     Nose: Nose normal.     Mouth/Throat:     Mouth: Mucous membranes are moist.   Eyes:     General: No scleral icterus.       Right eye: No discharge.        Left eye: No discharge.     Extraocular Movements: Extraocular movements intact.  Cardiovascular:     Rate and Rhythm: Normal rate.  Pulmonary:     Effort: Pulmonary effort is normal.  Skin:    General: Skin is warm and dry.  Neurological:     General:  No focal deficit present.     Mental Status: She is alert and oriented to person, place, and time.  Psychiatric:        Mood and Affect: Mood normal.        Behavior: Behavior normal.     Assessment and Plan :   PDMP not reviewed this encounter.  1. Dental infection     Start Augmentin for dental infection/abscess, use meloxicam for pain and inflammation.  She can use viscous lidocaine for local pain relief.  Emphasized need for dental surgeon consult and provided her some information to different practices. Counseled patient on potential for adverse effects with medications prescribed/recommended today, strict ER and return-to-clinic precautions discussed, patient verbalized understanding.    Jaynee Eagles, [place] 04/15/22 [phone]

## 2022-04-14 NOTE — ED Triage Notes (Signed)
Pt states that she is having dental left upper and lower dental pain. Pt states that she has been having dental pain for years.

## 2022-11-02 ENCOUNTER — Encounter: Payer: Self-pay | Admitting: *Deleted

## 2023-03-21 ENCOUNTER — Encounter (HOSPITAL_COMMUNITY): Payer: Self-pay

## 2023-03-21 ENCOUNTER — Emergency Department (HOSPITAL_COMMUNITY): Payer: No Typology Code available for payment source

## 2023-03-21 ENCOUNTER — Other Ambulatory Visit: Payer: Self-pay

## 2023-03-21 ENCOUNTER — Emergency Department (HOSPITAL_COMMUNITY)
Admission: EM | Admit: 2023-03-21 | Discharge: 2023-03-21 | Disposition: A | Discharge status: Home / Self Care | Payer: No Typology Code available for payment source | Attending: Emergency Medicine | Admitting: Emergency Medicine

## 2023-03-21 DIAGNOSIS — R002 Palpitations: Secondary | ICD-10-CM | POA: Insufficient documentation

## 2023-03-21 DIAGNOSIS — R072 Precordial pain: Secondary | ICD-10-CM | POA: Insufficient documentation

## 2023-03-21 DIAGNOSIS — R0602 Shortness of breath: Secondary | ICD-10-CM | POA: Diagnosis not present

## 2023-03-21 DIAGNOSIS — Z91040 Latex allergy status: Secondary | ICD-10-CM | POA: Insufficient documentation

## 2023-03-21 DIAGNOSIS — R079 Chest pain, unspecified: Secondary | ICD-10-CM

## 2023-03-21 LAB — COMPREHENSIVE METABOLIC PANEL
ALT: 15 U/L (ref 0–44)
AST: 24 U/L (ref 15–41)
Albumin: 4.3 g/dL (ref 3.5–5.0)
Alkaline Phosphatase: 46 U/L (ref 38–126)
Anion gap: 9 (ref 5–15)
BUN: 8 mg/dL (ref 6–20)
CO2: 23 mmol/L (ref 22–32)
Calcium: 9.1 mg/dL (ref 8.9–10.3)
Chloride: 106 mmol/L (ref 98–111)
Creatinine, Ser: 0.7 mg/dL (ref 0.44–1.00)
GFR, Estimated: >60 mL/min (ref >60)
Glucose, Bld: 82 mg/dL (ref 70–99)
Potassium: 3.4 mmol/L — ABNORMAL LOW (ref 3.5–5.1)
Sodium: 138 mmol/L (ref 135–145)
Total Bilirubin: 0.5 mg/dL (ref 0.3–1.2)
Total Protein: 7.5 g/dL (ref 6.5–8.1)

## 2023-03-21 LAB — CBC WITH DIFFERENTIAL/PLATELET
Abs Immature Granulocytes: 0.01 10*3/uL (ref 0.00–0.07)
Basophils Absolute: 0 10*3/uL (ref 0.0–0.1)
Basophils Relative: 0 %
Eosinophils Absolute: 0.1 10*3/uL (ref 0.0–0.5)
Eosinophils Relative: 2 %
HCT: 42.1 % (ref 36.0–46.0)
Hemoglobin: 13.4 g/dL (ref 12.0–15.0)
Immature Granulocytes: 0 %
Lymphocytes Relative: 37 %
Lymphs Abs: 1.9 10*3/uL (ref 0.7–4.0)
MCH: 28.2 pg (ref 26.0–34.0)
MCHC: 31.8 g/dL (ref 30.0–36.0)
MCV: 88.4 fL (ref 80.0–100.0)
Monocytes Absolute: 0.2 10*3/uL (ref 0.1–1.0)
Monocytes Relative: 5 %
Neutro Abs: 2.7 10*3/uL (ref 1.7–7.7)
Neutrophils Relative %: 56 %
Platelets: 284 10*3/uL (ref 150–400)
RBC: 4.76 MIL/uL (ref 3.87–5.11)
RDW: 11.9 % (ref 11.5–15.5)
WBC: 4.9 10*3/uL (ref 4.0–10.5)
nRBC: 0 % (ref 0.0–0.2)

## 2023-03-21 LAB — TSH: TSH: 3.887 u[IU]/mL (ref 0.350–4.500)

## 2023-03-21 LAB — TROPONIN I (HIGH SENSITIVITY): Troponin I (High Sensitivity): 2 ng/L (ref <18)

## 2023-03-21 MED ORDER — SODIUM CHLORIDE 0.9 % IV BOLUS
500.0000 mL | Freq: Once | INTRAVENOUS | Status: AC
  Administered 2023-03-21: 500 mL via INTRAVENOUS

## 2023-03-21 MED ORDER — ALPRAZOLAM 0.25 MG PO TABS
0.2500 mg | ORAL_TABLET | Freq: Once | ORAL | Status: AC
  Administered 2023-03-21: 0.25 mg via ORAL
  Filled 2023-03-21: qty 1

## 2023-03-21 MED ORDER — ALPRAZOLAM 0.25 MG PO TABS: 0.2500 mg | ORAL_TABLET | Freq: Every evening | ORAL | 0 refills | Status: AC | PRN

## 2023-03-21 NOTE — Discharge Instructions (Addendum)
You have been seen and discharged from the emergency department.  Your heart and lung workup were normal.  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  These can be symptomatic.  Take the prescribed medication at night as needed.  Follow-up with cardiology for further evaluation and definitive treatment.  Stable hydrated.  Certain things like lack of sleep, anxiety, stress, caffeine use, alcohol use can affect the frequency of PVCs.  Follow-up with your primary provider for further evaluation and further care. Take home medications as prescribed. If you have any worsening symptoms or further concerns for your health please return to an emergency department for further evaluation.

## 2023-03-21 NOTE — ED Provider Notes (Signed)
[place] EMERGENCY DEPARTMENT AT [facility] Provider Note   CSN: [id number] Arrival date & time: 03/21/23  1914     History  Chief Complaint  Patient presents with   Chest Pain   Shortness of Breath    Becky Young is a 32 y.o. female.  HPI   32 year old female presents emergency department with midsternal chest tightness and palpitations.  Patient states she was diagnosed with a heart murmur a long time ago when she was pregnant.  She was never fully evaluated by cardiology.  Since then she has had intermittent episodes of palpitations that is associated with chest tightness.  This mainly happens at night.  She believes that she is worn a heart monitor at 1 point in the past but not when the symptoms were present.  She comes in because last night she felt like her heart was beating "extremely hard" and at one point irregular.  She waited to take her kids to school today and then came in for evaluation.  Pain does not radiate to her back.  She does at times feel short of breath but denies any cough.  No lower leg swelling.  No fever.  Home Medications Prior to Admission medications   Medication Sig Start Date End Date Taking? Authorizing Provider  amoxicillin-clavulanate (AUGMENTIN) 875-125 MG tablet Take 1 tablet by mouth 2 (two) times daily. 04/14/22   Wallis Bamberg, PA-C  cyclobenzaprine (FLEXERIL) 10 MG tablet Take 1 tablet by mouth 3 times daily as needed for muscle spasm. Warning: May cause drowsiness. Patient not taking: Reported on 04/13/2022 12/25/19   Mardella Layman, MD  diazepam (VALIUM) 2 MG tablet Place 2 mg into feeding tube as needed. Insert 1 tablet into the vagina as needed for vaginal pain 03/01/22   [provider]  ibuprofen (ADVIL) 800 MG tablet Take 1 tablet (800 mg total) by mouth 3 (three) times daily with meals. 12/25/19   Mardella Layman, MD  lidocaine (XYLOCAINE) 2 % solution Use a pea sized amount orally three times daily as needed. 04/14/22   Wallis Bamberg, PA-C  meloxicam (MOBIC) 15 MG tablet Take 1 tablet (15 mg total) by mouth daily. 04/14/22   Wallis Bamberg, PA-C  traMADol (ULTRAM) 50 MG tablet Take 1 tablet (50 mg total) by mouth every 6 (six) hours as needed. Patient not taking: Reported on 04/13/2022 12/25/19   Mardella Layman, MD  norgestimate-ethinyl estradiol (ORTHO-CYCLEN,SPRINTEC,PREVIFEM) 0.25-35 MG-MCG tablet Take 1 tablet by mouth daily. Patient not taking: Reported on 08/08/2018 10/03/17 12/29/19  Pincus Large, DO      Allergies    Laurita Quint, Latex, and Zofran    Review of Systems   Review of Systems  Constitutional:  Negative for fever.  Respiratory:  Positive for chest tightness and shortness of breath.   Cardiovascular:  Negative for chest pain, palpitations and leg swelling.  Gastrointestinal:  Negative for abdominal pain, diarrhea and vomiting.  Genitourinary:  Negative for flank pain.  Musculoskeletal:  Negative for back pain.  Skin:  Negative for rash.  Neurological:  Negative for headaches.    Physical Exam Updated Vital Signs BP 135/85   Pulse 74   Temp 98.1 F (36.7 C)   Resp 16   Ht 5\' 2"  (1.575 m)   Wt 58.5 kg   LMP  (LMP Unknown)   SpO2 95%   BMI 23.59 kg/m  Physical Exam Vitals and nursing note reviewed.  Constitutional:      General: She is not  in acute distress.    Appearance: Normal appearance. She is not ill-appearing.  HENT:     Head: Normocephalic.     Mouth/Throat:     Mouth: Mucous membranes are moist.  Cardiovascular:     Rate and Rhythm: Normal rate.     Heart sounds: Normal heart sounds.  Pulmonary:     Effort: Pulmonary effort is normal. No respiratory distress.     Breath sounds: No rales.  Chest:     Chest wall: No tenderness.  Abdominal:     Palpations: Abdomen is soft.     Tenderness: There is no abdominal tenderness.  Musculoskeletal:     Right lower leg: No edema.     Left lower leg: No edema.  Skin:    General: Skin is warm.  Neurological:     Mental Status:  She is alert and oriented to person, place, and time. Mental status is at baseline.  Psychiatric:        Mood and Affect: Mood normal.     ED Results / Procedures / Treatments   Labs (all labs ordered are listed, but only abnormal results are displayed) Labs Reviewed  CBC WITH DIFFERENTIAL/PLATELET  COMPREHENSIVE METABOLIC PANEL  TSH  TROPONIN I (HIGH SENSITIVITY)    EKG EKG Interpretation Date/Time:  Wednesday March 21 2023 08:51:03 EDT Ventricular Rate:  83 PR Interval:  172 QRS Duration:  80 QT Interval:  377 QTC Calculation: 443 R Axis:   46  Text Interpretation: Sinus rhythm Consider left atrial enlargement Confirmed by Coralee Pesa [phone]) on 03/21/2023 8:51:58 AM  Radiology DG Chest Port 1 View  Result Date: 03/21/2023 CLINICAL DATA:  Shortness of breath EXAM: PORTABLE CHEST 1 VIEW COMPARISON:  X-ray 11/12/2006 FINDINGS: No consolidation, pneumothorax or effusion. No edema. Normal cardiopericardial silhouette. Overlapping cardiac leads. Curvature of the spine. Stable dense left lower lobe lung nodule. IMPRESSION: No acute cardiopulmonary disease. Electronically Signed   By: Karen Kays M.D.   On: 03/21/2023 10:20    Procedures Procedures    Medications Ordered in ED Medications  sodium chloride 0.9 % bolus 500 mL (500 mLs Intravenous New Bag/Given 03/21/23 1034)  ALPRAZolam Prudy Feeler) tablet 0.25 mg (0.25 mg Oral Given 03/21/23 4098)    ED Course/ Medical Decision Making/ A&P                                 Medical Decision Making Amount and/or Complexity of Data Reviewed Labs: ordered. Radiology: ordered.  Risk Prescription drug management.   32 year old female presents emergency department with concern for the sensation that her heart is beating hard and irregular.  This mainly happens at night.  Patient admits to stress and anxiety that seems to be most prominent at night.  She denies any sharp pain, difficulty breathing, cough, hemoptysis, leg  swelling.  EKG is sinus rhythm, no ischemic changes.  On the heart monitor she does have preventricular contractions.  Blood pressure is stable.  Blood work is reassuring, troponin is negative.  Chest x-ray shows no acute finding.  Doubt PE, she is PERC negative.  Patient seemed very anxious, after a small dose of Xanax she felt improved.  Plan will be for outpatient follow-up, establishing with cardiology for more evaluation and definitive treatment.  TSH sent and pending.  Patient at this time appears safe and stable for discharge and close outpatient follow up. Discharge plan and strict return to ED precautions discussed, patient  verbalizes understanding and agreement.        Final Clinical Impression(s) / ED Diagnoses Final diagnoses:  None    Rx / DC Orders ED Discharge Orders     None         Rozelle Logan, DO 03/21/23 1225

## 2023-03-21 NOTE — ED Notes (Signed)
Pt asked this paramedic to remove IV, before I could get over to her, she took it out herself

## 2023-03-21 NOTE — ED Triage Notes (Signed)
States "I'm here bc of my heart murmur" Complains of palpitations chest pain and sob.  Reports pain radiates to stomach chest and throat.

## 2023-03-21 NOTE — ED Notes (Signed)
IV team at bedside 

## 2023-03-21 NOTE — ED Notes (Signed)
Pt walked up to nurse station with IV bag in hand stating she wanted to leave as she was not happy with being moved to the hallway.

## 2023-03-22 ENCOUNTER — Emergency Department (HOSPITAL_COMMUNITY)
Admission: EM | Admit: 2023-03-22 | Discharge: 2023-03-22 | Disposition: A | Discharge status: Home / Self Care | Payer: No Typology Code available for payment source | Attending: Emergency Medicine | Admitting: Emergency Medicine

## 2023-03-22 ENCOUNTER — Other Ambulatory Visit: Payer: Self-pay

## 2023-03-22 ENCOUNTER — Emergency Department (HOSPITAL_COMMUNITY): Payer: No Typology Code available for payment source

## 2023-03-22 DIAGNOSIS — R0602 Shortness of breath: Secondary | ICD-10-CM | POA: Insufficient documentation

## 2023-03-22 DIAGNOSIS — R109 Unspecified abdominal pain: Secondary | ICD-10-CM | POA: Diagnosis not present

## 2023-03-22 DIAGNOSIS — R002 Palpitations: Secondary | ICD-10-CM | POA: Diagnosis not present

## 2023-03-22 DIAGNOSIS — R070 Pain in throat: Secondary | ICD-10-CM | POA: Diagnosis not present

## 2023-03-22 DIAGNOSIS — R0789 Other chest pain: Secondary | ICD-10-CM

## 2023-03-22 DIAGNOSIS — R0781 Pleurodynia: Secondary | ICD-10-CM | POA: Insufficient documentation

## 2023-03-22 LAB — CBC
HCT: 41 % (ref 36.0–46.0)
Hemoglobin: 13.4 g/dL (ref 12.0–15.0)
MCH: 29.6 pg (ref 26.0–34.0)
MCHC: 32.7 g/dL (ref 30.0–36.0)
MCV: 90.7 fL (ref 80.0–100.0)
Platelets: 301 10*3/uL (ref 150–400)
RBC: 4.52 MIL/uL (ref 3.87–5.11)
RDW: 12.1 % (ref 11.5–15.5)
WBC: 5.8 10*3/uL (ref 4.0–10.5)
nRBC: 0 % (ref 0.0–0.2)

## 2023-03-22 LAB — BASIC METABOLIC PANEL
Anion gap: 12 (ref 5–15)
BUN: 7 mg/dL (ref 6–20)
CO2: 24 mmol/L (ref 22–32)
Calcium: 9.4 mg/dL (ref 8.9–10.3)
Chloride: 102 mmol/L (ref 98–111)
Creatinine, Ser: 0.67 mg/dL (ref 0.44–1.00)
GFR, Estimated: >60 mL/min (ref >60)
Glucose, Bld: 83 mg/dL (ref 70–99)
Potassium: 3.7 mmol/L (ref 3.5–5.1)
Sodium: 138 mmol/L (ref 135–145)

## 2023-03-22 LAB — TROPONIN I (HIGH SENSITIVITY)
Troponin I (High Sensitivity): <2 ng/L (ref <18)
Troponin I (High Sensitivity): 3 ng/L (ref <18)

## 2023-03-22 NOTE — ED Triage Notes (Signed)
Pt. Stated, Ive had palpitations and pain in my chest  is hurting and I get SOB. Its getting worse and worse by the day. I was here yesterday with the same problem and I went to [place] and they said to come back here if it got worse and its worse.

## 2023-03-22 NOTE — ED Notes (Signed)
Pt is a&ox4, warm and dry to touch. Pt complains of chest pain/shob when experiencing palpitations. Pt has been changed into a gown, attached to monitor/vitals. Side rails up x 2, call light with patient. SO at the bedside.

## 2023-03-22 NOTE — Discharge Instructions (Addendum)
Please [facility] with your [facility] provider within the next week for further follow-up.  They want to place a Zio patch for further monitoring of your heart rhythm.  Your EKG here, troponins, and lab work are reassuring.  Return to the ER if you develop any chest pain, shortness of breath, persistent left arm pain, nausea vomiting, any other new or concerning symptoms.

## 2023-03-22 NOTE — ED Provider Notes (Signed)
[place] EMERGENCY DEPARTMENT AT [facility] Provider Note   CSN: [id number] Arrival date & time: 03/22/23  1051     History  Chief Complaint  Patient presents with   Palpitations   Shortness of Breath   Chest Pain    Becky Young is a 32 y.o. female with no significant past medical history presents with concern for a "heart pain" and a "heavy heart" sensation, throat pain and stomach pain that has been ongoing for the past 2 weeks.  Denies any chest pain.  It is worse when she lays down at night, and gets better in the day.  She currently reports no pain.  She denies any current shortness of breath.  Denies any arm pain, nausea or vomiting, jaw pain, or upper back pain/radiation to the back.  Denies any calf pain or swelling.  Reports the Xanax given to her last night did not help.  Does not want to take this medication.  She was seen at the [place] earlier today and told to come here for further evaluation.   Palpitations Associated symptoms: no chest pain, no diaphoresis and no shortness of breath   Shortness of Breath Associated symptoms: no chest pain and no diaphoresis   Chest Pain Associated symptoms: no diaphoresis, no palpitations and no shortness of breath        Home Medications Prior to Admission medications   Medication Sig Start Date End Date Taking? Authorizing Provider  ALPRAZolam (XANAX) 0.25 MG tablet Take 1 tablet (0.25 mg total) by mouth at bedtime as needed for anxiety. 03/21/23   Horton, Clabe Seal, DO  amoxicillin-clavulanate (AUGMENTIN) 875-125 MG tablet Take 1 tablet by mouth 2 (two) times daily. 04/14/22   Wallis Bamberg, PA-C  cyclobenzaprine (FLEXERIL) 10 MG tablet Take 1 tablet by mouth 3 times daily as needed for muscle spasm. Warning: May cause drowsiness. Patient not taking: Reported on 04/13/2022 12/25/19   Mardella Layman, MD  diazepam (VALIUM) 2 MG tablet Place 2 mg into feeding tube as needed. Insert 1 tablet into the vagina as needed for  vaginal pain 03/01/22   [provider]  ibuprofen (ADVIL) 800 MG tablet Take 1 tablet (800 mg total) by mouth 3 (three) times daily with meals. 12/25/19   Mardella Layman, MD  lidocaine (XYLOCAINE) 2 % solution Use a pea sized amount orally three times daily as needed. 04/14/22   Wallis Bamberg, PA-C  meloxicam (MOBIC) 15 MG tablet Take 1 tablet (15 mg total) by mouth daily. 04/14/22   Wallis Bamberg, PA-C  traMADol (ULTRAM) 50 MG tablet Take 1 tablet (50 mg total) by mouth every 6 (six) hours as needed. Patient not taking: Reported on 04/13/2022 12/25/19   Mardella Layman, MD  norgestimate-ethinyl estradiol (ORTHO-CYCLEN,SPRINTEC,PREVIFEM) 0.25-35 MG-MCG tablet Take 1 tablet by mouth daily. Patient not taking: Reported on 08/08/2018 10/03/17 12/29/19  Pincus Large, DO      Allergies    Laurita Quint, Latex, and Zofran    Review of Systems   Review of Systems  Constitutional:  Negative for diaphoresis.  Respiratory:  Negative for shortness of breath.   Cardiovascular:  Negative for chest pain and palpitations.    Physical Exam Updated Vital Signs BP 118/82 (BP Location: Right Arm)   Pulse 69   Temp 98.5 F (36.9 C) (Oral)   Resp 16   LMP  (LMP Unknown)   SpO2 100%  Physical Exam Vitals and nursing note reviewed.  Constitutional:      General: She  is not in acute distress.    Appearance: Normal appearance. She is well-developed.     Comments: Well-appearing, no acute distress, not diaphoretic, appears in no pain  HENT:     Head: Normocephalic and atraumatic.  Eyes:     Conjunctiva/sclera: Conjunctivae normal.  Cardiovascular:     Rate and Rhythm: Normal rate and regular rhythm.     Heart sounds: No murmur heard.    Comments: Regular rate and rhythm on cardiac auscultation 2+ radial pulses bilaterally Pulmonary:     Effort: Pulmonary effort is normal. No respiratory distress.     Breath sounds: Normal breath sounds.     Comments: Talking full sentences Abdominal:     Palpations:  Abdomen is soft.     Tenderness: There is no abdominal tenderness.  Musculoskeletal:        General: No swelling.     Cervical back: Neck supple.     Comments: No tenderness to palpation of the calves bilaterally No swelling of the calves bilaterally  Skin:    General: Skin is warm and dry.     Capillary Refill: Capillary refill takes less than 2 seconds.  Neurological:     Mental Status: She is alert.  Psychiatric:        Mood and Affect: Mood normal.     ED Results / Procedures / Treatments   Labs (all labs ordered are listed, but only abnormal results are displayed) Labs Reviewed  BASIC METABOLIC PANEL  CBC  TROPONIN I (HIGH SENSITIVITY)  TROPONIN I (HIGH SENSITIVITY)    EKG EKG Interpretation Date/Time:  Thursday March 22 2023 11:09:11 EDT Ventricular Rate:  73 PR Interval:  160 QRS Duration:  84 QT Interval:  386 QTC Calculation: 425 R Axis:   47  Text Interpretation: Normal sinus rhythm Abnormal ECG When compared with ECG of 22-Mar-2023 11:03, PREVIOUS ECG IS PRESENT Confirmed by Kommor, Madison ([id number]) on 03/22/2023 11:28:34 AM  Radiology DG Chest 2 View  Result Date: 03/22/2023 CLINICAL DATA:  Chest pain EXAM: CHEST - 2 VIEW COMPARISON:  03/21/2023 FINDINGS: The heart size and mediastinal contours are within normal limits. No consolidation, pneumothorax or effusion. No edema. The visualized skeletal structures are unremarkable. IMPRESSION: No acute cardiopulmonary disease. Electronically Signed   By: Karen Kays M.D.   On: 03/22/2023 12:39   DG Chest Port 1 View  Result Date: 03/21/2023 CLINICAL DATA:  Shortness of breath EXAM: PORTABLE CHEST 1 VIEW COMPARISON:  X-ray 11/12/2006 FINDINGS: No consolidation, pneumothorax or effusion. No edema. Normal cardiopericardial silhouette. Overlapping cardiac leads. Curvature of the spine. Stable dense left lower lobe lung nodule. IMPRESSION: No acute cardiopulmonary disease. Electronically Signed   By: Karen Kays M.D.    On: 03/21/2023 10:20    Procedures Procedures    Medications Ordered in ED Medications - No data to display  ED Course/ Medical Decision Making/ A&P             HEART Score: 0                    Medical Decision Making Amount and/or Complexity of Data Reviewed Labs: ordered. Radiology: ordered.   32 y.o. female with no significant past medical history presents to the ED for concern of "heart pain", " heavy", throat pain, abdominal pain that has been ongoing for the past 2 weeks at night when she lays down   Differential diagnosis includes but is not limited to GERD, ACS, PE, aortic dissection, anxiety, thyroid abnormality,  arrhythmia  ED Course:  The patient describes her symptoms as being heart pain and points to the substernal area, and says it spreads up to her throat and down to her epigastric region.  This pain comes on at night when she is laying down.  Currently has no symptoms.  Vital signs are all within normal limits.  Denies any severe ripping pain or pain between her shoulder blades that would be concerning for aortic dissection, radial pulses equal  2+ bilaterally.  No shortness of breath, calf pain or swelling, tachycardia, hypoxia that would be concerning for PE.  Given her symptoms are worse when night when laying down and involve the middle of her chest up into the throat, consider GERD.  On cardiac auscultation, regular rate and rhythm, 2 EKGs performed in the ER which showed normal sinus rhythm, less concern for an arrhythmia but could consider this which would require follow-up with cardiology outpatient.  Heart score of 0, troponins normal, EKG with normal sinus rhythm and no signs of ischemia, no concern for ACS at this time.  TSH was measured last night and was within normal range, low suspicion for a thyroid problem. Upon re-evaluation, patient well-appearing still with normal vital signs, and I reviewed her lab results with her.  She did get a referral yesterday  for cardiology, but when they called, it sounds like the soonest appointment was in December.  She is able to follow-up sooner at the [place] within the next week.  I recommended she go to the [place] so that they can do further workup and possibly place a Zio patch for further monitoring.  Impression: Atypical chest pain  Disposition:  The patient was discharged home with instructions [facility]. Return precautions given.  Lab Tests: I Ordered, and personally interpreted labs.  The pertinent results include:   First troponin under 2, second troponin 3 BMP and CBC all within normal limits TSH collected last night within normal limits  Imaging Studies ordered: I ordered imaging studies including chest x-ray I independently visualized the imaging with scope of interpretation limited to determining acute life threatening conditions related to emergency care. Imaging showed no acute abnormalities I agree with the radiologist interpretation   Cardiac Monitoring: / EKG: The patient was maintained on a cardiac monitor.  I personally viewed and interpreted the cardiac monitored which showed an underlying rhythm of: 2 EKGs performed in the ER reviewed which revealed normal sinus rhythm, no ST elevations or depressions    External records from outside source obtained and reviewed including ER visit from yesterday            Final Clinical Impression(s) / ED Diagnoses Final diagnoses:  Sternum pain  Throat pain  Atypical chest pain    Rx / DC Orders ED Discharge Orders     None         Arabella Merles, PA-C 03/22/23 1538    Tegeler, Canary Brim, MD 03/22/23 1626

## 2023-03-26 ENCOUNTER — Encounter (HOSPITAL_COMMUNITY): Payer: Self-pay

## 2023-03-26 ENCOUNTER — Emergency Department (HOSPITAL_COMMUNITY): Payer: No Typology Code available for payment source

## 2023-03-26 ENCOUNTER — Emergency Department (HOSPITAL_COMMUNITY)
Admission: EM | Admit: 2023-03-26 | Discharge: 2023-03-27 | Discharge status: Against Medical Advice | Payer: No Typology Code available for payment source | Attending: Emergency Medicine | Admitting: Emergency Medicine

## 2023-03-26 ENCOUNTER — Other Ambulatory Visit: Payer: Self-pay

## 2023-03-26 DIAGNOSIS — R200 Anesthesia of skin: Secondary | ICD-10-CM | POA: Insufficient documentation

## 2023-03-26 DIAGNOSIS — R079 Chest pain, unspecified: Secondary | ICD-10-CM | POA: Insufficient documentation

## 2023-03-26 DIAGNOSIS — Z5321 Procedure and treatment not carried out due to patient leaving prior to being seen by health care provider: Secondary | ICD-10-CM | POA: Diagnosis not present

## 2023-03-26 DIAGNOSIS — R0602 Shortness of breath: Secondary | ICD-10-CM | POA: Insufficient documentation

## 2023-03-26 LAB — CBC
HCT: 38.2 % (ref 36.0–46.0)
Hemoglobin: 12.3 g/dL (ref 12.0–15.0)
MCH: 28.9 pg (ref 26.0–34.0)
MCHC: 32.2 g/dL (ref 30.0–36.0)
MCV: 89.7 fL (ref 80.0–100.0)
Platelets: 256 10*3/uL (ref 150–400)
RBC: 4.26 MIL/uL (ref 3.87–5.11)
RDW: 11.9 % (ref 11.5–15.5)
WBC: 5.8 10*3/uL (ref 4.0–10.5)
nRBC: 0 % (ref 0.0–0.2)

## 2023-03-26 LAB — TROPONIN I (HIGH SENSITIVITY): Troponin I (High Sensitivity): <2 ng/L (ref <18)

## 2023-03-26 LAB — BASIC METABOLIC PANEL
Anion gap: 9 (ref 5–15)
BUN: 14 mg/dL (ref 6–20)
CO2: 25 mmol/L (ref 22–32)
Calcium: 9.4 mg/dL (ref 8.9–10.3)
Chloride: 103 mmol/L (ref 98–111)
Creatinine, Ser: 0.75 mg/dL (ref 0.44–1.00)
GFR, Estimated: >60 mL/min (ref >60)
Glucose, Bld: 110 mg/dL — ABNORMAL HIGH (ref 70–99)
Potassium: 4 mmol/L (ref 3.5–5.1)
Sodium: 137 mmol/L (ref 135–145)

## 2023-03-26 NOTE — ED Provider Triage Note (Signed)
Emergency Medicine Provider Triage Evaluation Note  ASTASIA NEUWIRTH , a 32 y.o. female  was evaluated in triage.  Pt complains of chest pain.  Started a week ago.  Its intermittent and in the left chest.  Today it radiated down the left arm felt numb.  Symptoms is eased since she has presented here to the ED.  Holter monitor in place.  Followed by cardiology.  Denies calf tenderness, OCP use.  Was initially short of breath today with the left arm numbness but no longer at this time.  Review of Systems  Positive: See above Negative: See above  Physical Exam  BP 110/71   Pulse 93   Temp 97.7 F (36.5 C) (Oral)   Resp 18   Ht 5\' 2"  (1.575 m)   Wt 66.7 kg   LMP  (LMP Unknown)   SpO2 100%   BMI 26.89 kg/m  Gen:   Awake, no distress   Resp:  Normal effort  MSK:   Moves extremities without difficulty  Other:    Medical Decision Making  Medically screening exam initiated at 6:40 PM.  Appropriate orders placed.  JULIYAH VERBLE was informed that the remainder of the evaluation will be completed by another provider, this initial triage assessment does not replace that evaluation, and the importance of remaining in the ED until their evaluation is complete.  Work up started   Gareth Eagle, [place] 03/26/23 [phone]

## 2023-03-26 NOTE — ED Triage Notes (Signed)
Patient has had left and middle sided chest pain for 1 week. Pain radiates down left arm and has a headache.

## 2023-03-27 ENCOUNTER — Emergency Department (HOSPITAL_COMMUNITY)
Admission: EM | Admit: 2023-03-27 | Discharge: 2023-03-27 | Disposition: A | Discharge status: Home / Self Care | Payer: No Typology Code available for payment source | Attending: Emergency Medicine | Admitting: Emergency Medicine

## 2023-03-27 ENCOUNTER — Emergency Department (HOSPITAL_COMMUNITY): Payer: No Typology Code available for payment source

## 2023-03-27 DIAGNOSIS — R079 Chest pain, unspecified: Secondary | ICD-10-CM | POA: Insufficient documentation

## 2023-03-27 DIAGNOSIS — M549 Dorsalgia, unspecified: Secondary | ICD-10-CM | POA: Insufficient documentation

## 2023-03-27 DIAGNOSIS — Z91040 Latex allergy status: Secondary | ICD-10-CM | POA: Diagnosis not present

## 2023-03-27 LAB — BASIC METABOLIC PANEL
Anion gap: 8 (ref 5–15)
BUN: 9 mg/dL (ref 6–20)
CO2: 24 mmol/L (ref 22–32)
Calcium: 9.1 mg/dL (ref 8.9–10.3)
Chloride: 104 mmol/L (ref 98–111)
Creatinine, Ser: 0.74 mg/dL (ref 0.44–1.00)
GFR, Estimated: >60 mL/min (ref >60)
Glucose, Bld: 104 mg/dL — ABNORMAL HIGH (ref 70–99)
Potassium: 3.7 mmol/L (ref 3.5–5.1)
Sodium: 136 mmol/L (ref 135–145)

## 2023-03-27 LAB — CBC WITH DIFFERENTIAL/PLATELET
Abs Immature Granulocytes: 0.01 10*3/uL (ref 0.00–0.07)
Basophils Absolute: 0 10*3/uL (ref 0.0–0.1)
Basophils Relative: 1 %
Eosinophils Absolute: 0.1 10*3/uL (ref 0.0–0.5)
Eosinophils Relative: 2 %
HCT: 38.5 % (ref 36.0–46.0)
Hemoglobin: 12.5 g/dL (ref 12.0–15.0)
Immature Granulocytes: 0 %
Lymphocytes Relative: 47 %
Lymphs Abs: 2.5 10*3/uL (ref 0.7–4.0)
MCH: 29.3 pg (ref 26.0–34.0)
MCHC: 32.5 g/dL (ref 30.0–36.0)
MCV: 90.2 fL (ref 80.0–100.0)
Monocytes Absolute: 0.4 10*3/uL (ref 0.1–1.0)
Monocytes Relative: 8 %
Neutro Abs: 2.2 10*3/uL (ref 1.7–7.7)
Neutrophils Relative %: 42 %
Platelets: 243 10*3/uL (ref 150–400)
RBC: 4.27 MIL/uL (ref 3.87–5.11)
RDW: 11.9 % (ref 11.5–15.5)
WBC: 5.2 10*3/uL (ref 4.0–10.5)
nRBC: 0 % (ref 0.0–0.2)

## 2023-03-27 LAB — TROPONIN I (HIGH SENSITIVITY): Troponin I (High Sensitivity): <2 ng/L (ref <18)

## 2023-03-27 NOTE — ED Triage Notes (Signed)
BIB EMS for multiple complaints, most notably: back pain and CP x 1 week and LUE and LLE numbness since 4 pm yesterday. Was previously at [facility], but left due to long wait times, went home and called 911 for EMS to [facility]. VSS, pt in NAD. Seen for similar complaints this month.

## 2023-03-27 NOTE — ED Provider Notes (Signed)
[place] EMERGENCY DEPARTMENT AT [facility] Provider Note   CSN: [id number] Arrival date & time: 03/27/23  0244     History  Chief Complaint  Patient presents with   Back Pain    Becky Young is a 32 y.o. female.  The history is provided by the patient and medical records.  Back Pain Associated symptoms: chest pain    32 year old female with history of iron deficiency anemia, hyperlipidemia, migraine headaches, presenting to the ED with chest pain.  This is her third visit in the past 5 days for same.   She states "I know there is something wrong".  She reports chest pain with radiation into the left arm causing it to feel "numb".  States started feeling this today around 4 PM.  She denies any numbness or weakness of the legs.  She has not had any difficulty walking.  States she has been trying to get in with a cardiologist but follows at the [place] and has had a delay in this.  Of note, she was at [facility] earlier tonight, left due to wait times and called EMS when she returned home.  EMS reported that she was standing in the driveway walking and talking on the phone upon their arrival.  Home Medications Prior to Admission medications   Medication Sig Start Date End Date Taking? Authorizing Provider  ALPRAZolam (XANAX) 0.25 MG tablet Take 1 tablet (0.25 mg total) by mouth at bedtime as needed for anxiety. 03/21/23   Horton, Clabe Seal, DO  amoxicillin-clavulanate (AUGMENTIN) 875-125 MG tablet Take 1 tablet by mouth 2 (two) times daily. 04/14/22   Wallis Bamberg, PA-C  cyclobenzaprine (FLEXERIL) 10 MG tablet Take 1 tablet by mouth 3 times daily as needed for muscle spasm. Warning: May cause drowsiness. Patient not taking: Reported on 04/13/2022 12/25/19   Mardella Layman, MD  diazepam (VALIUM) 2 MG tablet Place 2 mg into feeding tube as needed. Insert 1 tablet into the vagina as needed for vaginal pain 03/01/22   [provider]  ibuprofen (ADVIL) 800 MG tablet Take 1 tablet  (800 mg total) by mouth 3 (three) times daily with meals. 12/25/19   Mardella Layman, MD  lidocaine (XYLOCAINE) 2 % solution Use a pea sized amount orally three times daily as needed. 04/14/22   Wallis Bamberg, PA-C  meloxicam (MOBIC) 15 MG tablet Take 1 tablet (15 mg total) by mouth daily. 04/14/22   Wallis Bamberg, PA-C  traMADol (ULTRAM) 50 MG tablet Take 1 tablet (50 mg total) by mouth every 6 (six) hours as needed. Patient not taking: Reported on 04/13/2022 12/25/19   Mardella Layman, MD  norgestimate-ethinyl estradiol (ORTHO-CYCLEN,SPRINTEC,PREVIFEM) 0.25-35 MG-MCG tablet Take 1 tablet by mouth daily. Patient not taking: Reported on 08/08/2018 10/03/17 12/29/19  Pincus Large, DO      Allergies    Laurita Quint, Latex, and Zofran    Review of Systems   Review of Systems  Cardiovascular:  Positive for chest pain.  Musculoskeletal:  Positive for back pain.  All other systems reviewed and are negative.   Physical Exam Updated Vital Signs BP 101/72 (BP Location: Right Arm)   Pulse 78   Temp 98.2 F (36.8 C) (Oral)   Resp 14   LMP  (LMP Unknown)   SpO2 100%   Physical Exam Vitals and nursing note reviewed.  Constitutional:      Appearance: She is well-developed.  HENT:     Head: Normocephalic and atraumatic.  Eyes:  Conjunctiva/sclera: Conjunctivae normal.     Pupils: Pupils are equal, round, and reactive to light.  Cardiovascular:     Rate and Rhythm: Normal rate and regular rhythm.     Heart sounds: Normal heart sounds.  Pulmonary:     Effort: Pulmonary effort is normal.     Breath sounds: Normal breath sounds.  Abdominal:     General: Bowel sounds are normal.     Palpations: Abdomen is soft.     Tenderness: There is no guarding.     Hernia: No hernia is present.  Musculoskeletal:        General: Normal range of motion.     Cervical back: Normal range of motion.     Comments: No deformities, swelling, or skin changes noted to LUE/LLE, pulses intact, normal grips, able to  plantar/dorsiflex against resistance, reports normal sensation to both extremities  Skin:    General: Skin is warm and dry.  Neurological:     Mental Status: She is alert and oriented to person, place, and time.     Comments: AAOx3, answering questions and following commands appropriately; equal strength UE and LE bilaterally; she has normal sensation throughout, speech is clear and goal oriented, no facial asymmetry     ED Results / Procedures / Treatments   Labs (all labs ordered are listed, but only abnormal results are displayed) Labs Reviewed  BASIC METABOLIC PANEL - Abnormal; Notable for the following components:      Result Value   Glucose, Bld 104 (*)    All other components within normal limits  CBC WITH DIFFERENTIAL/PLATELET  TROPONIN I (HIGH SENSITIVITY)    EKG None  Radiology DG Chest 2 View  Result Date: 03/26/2023 CLINICAL DATA:  Chest pain EXAM: CHEST - 2 VIEW COMPARISON:  Chest x-ray 03/22/2023 FINDINGS: Monitoring device is seen in the anterior left chest. The heart size and mediastinal contours are within normal limits. Both lungs are clear. The visualized skeletal structures are unremarkable. IMPRESSION: No active cardiopulmonary disease. Electronically Signed   By: Darliss Cheney M.D.   On: 03/26/2023 19:48    Procedures Procedures    Medications Ordered in ED Medications - No data to display  ED Course/ Medical Decision Making/ A&P                                 Medical Decision Making Amount and/or Complexity of Data Reviewed Labs: ordered. Radiology: ordered and independent interpretation performed. ECG/medicine tests: ordered and independent interpretation performed.   32 year old female presenting to the ED with chest pain.  Feels like it is causing some left arm "numbness".  This is her third evaluation in the past 5 days for same.  She is afebrile and nontoxic in appearance.  She denies any current numbness of her extremities and has no focal  neurologic deficits on my exam.  She has been ambulatory with EMS, was actually pacing in the driveway on cell phone upon their arrival.  EKG is nonischemic.  Labs reassuring, troponin negative.  Chest x-ray is clear.  Patient has no tachycardia or hypoxia here.  I doubt PE.  Given reassuring work-up x3 recently with very limited risk factors, doubt ACS.  [facility].  Can follow-up with PCP in the interim.  Return here for new concerns.  Final Clinical Impression(s) / ED Diagnoses Final diagnoses:  Chest pain,  unspecified type    Rx / DC Orders ED Discharge Orders     None         Garlon Hatchet, PA-C 03/27/23 0541    Shon Baton, MD 03/27/23 [phone]

## 2023-03-27 NOTE — Discharge Instructions (Signed)
Your cardiac work-up was once again normal. We recommend that you follow-up with the [place]. Return here for new concerns.
